# Patient Record
Sex: Female | Born: 1995 | Race: White | Hispanic: No | Marital: Single | Smoking: Never smoker
Health system: Southern US, Community
[De-identification: ages and names within clinical notes are randomized; demographics above are authoritative.]

## PROBLEM LIST (undated history)

## (undated) DIAGNOSIS — F419 Anxiety disorder, unspecified: Secondary | ICD-10-CM

## (undated) DIAGNOSIS — T7840XA Allergy, unspecified, initial encounter: Secondary | ICD-10-CM

## (undated) DIAGNOSIS — F32A Depression, unspecified: Secondary | ICD-10-CM

## (undated) DIAGNOSIS — L409 Psoriasis, unspecified: Secondary | ICD-10-CM

## (undated) HISTORY — DX: Depression, unspecified: F32.A

## (undated) HISTORY — PX: WISDOM TOOTH EXTRACTION: SHX21

## (undated) HISTORY — DX: Allergy, unspecified, initial encounter: T78.40XA

## (undated) HISTORY — DX: Anxiety disorder, unspecified: F41.9

---

## 2016-08-15 ENCOUNTER — Ambulatory Visit (INDEPENDENT_AMBULATORY_CARE_PROVIDER_SITE_OTHER): Payer: BLUE CROSS/BLUE SHIELD | Admitting: Physician Assistant

## 2016-08-15 ENCOUNTER — Encounter: Payer: Self-pay | Admitting: Physician Assistant

## 2016-08-15 VITALS — BP 118/82 | HR 72 | Temp 98.0°F | Resp 16 | Ht 68.0 in | Wt 208.0 lb

## 2016-08-15 DIAGNOSIS — M25561 Pain in right knee: Secondary | ICD-10-CM | POA: Insufficient documentation

## 2016-08-15 DIAGNOSIS — Z0000 Encounter for general adult medical examination without abnormal findings: Secondary | ICD-10-CM

## 2016-08-15 DIAGNOSIS — G8929 Other chronic pain: Secondary | ICD-10-CM

## 2016-08-15 NOTE — Progress Notes (Signed)
Patient: Diana Reed, Female    DOB: [birth year], 21 y.o.   MRN: [id number] Visit Date: 08/15/2016  Today's Provider: Trey Sailors, PA-C   Chief Complaint  Patient presents with  . Establish Care  . Annual Exam  . Insomnia    On going chronic issue, worsening in the last 2-3 years.   . Knee Pain    Bilateral; right worse than left.   Subjective:    Annual physical exam Diana Reed is a 21 y.o. female who presents today for health maintenance and complete physical. She feels fairly well. She reports exercising regularly. She reports she is sleeping poorly.  She moved here from [place] one year ago to live with her mom and step father in [place]. 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  Sees [facility], they prescribe her OCP and do breast exams. Next appt in July. Has had some menstrual issues that she will address with them.  MOM prediabetic DAD no health problems Two brothers, both healthy  Single Never sexually active  No Smoking, Drinking, No drugs.  No health problems. Has some down and depressed mood, some anxiety, thoughts racing at night. Started her senior year. Does not desire medications or counseling, talks to her sister in law often.  Also has some right knee pain after fall in basketball 4 years ago. Intermittently aches and becomes stiff. Able to walk okay.  ROI from previous practice  [facility], [place], [place]  Family hx + breast cancer in grandmother, cervical cancer in aunt. No colon cancer. -----------------------------------------------------------------   Review of Systems  Constitutional: Negative.   HENT: Negative.   Eyes: Negative.   Respiratory: Negative.   Cardiovascular: Negative.   Gastrointestinal: Negative.   Endocrine: Negative.   Genitourinary: Negative.   Musculoskeletal: Positive for arthralgias (Bilateral Knee pain).  Neurological: Positive for headaches.    Psychiatric/Behavioral: Positive for sleep disturbance. The patient is nervous/anxious.     Social History      She  reports that she has never smoked. She has never used smokeless tobacco. She reports that she does not drink alcohol or use drugs.       Social History   Social History  . Marital status: Single    Spouse name: N/A  . Number of children: N/A  . Years of education: N/A   Occupational History  . Full time student   . Part time employee    Social History Main Topics  . Smoking status: Never Smoker  . Smokeless tobacco: Never Used  . Alcohol use No  . Drug use: No  . Sexual activity: No   Other Topics Concern  . None   Social History Narrative  . None    No past medical history on file.   There are no active problems to display for this patient.   History reviewed. No pertinent surgical history.  Family History        Family Status  Relation Status  . Mother Alive  . Father Alive  . Brother Alive  . MGM Alive  . PGM Alive  . Brother Alive  . Mat Aunt Alive        Her family history includes Breast cancer in her paternal grandmother; Cervical cancer in her maternal aunt; Diabetes in her maternal grandmother; Healthy in her brother, brother, and father; Stomach cancer in her maternal grandmother.     No Known Allergies  Current Outpatient Prescriptions:  .  Norethindrone-Ethinyl Estradiol-Fe Biphas (LO LOESTRIN FE) 1 MG-10 MCG / 10 MCG tablet, Take 1 tablet by mouth daily., Disp: , Rfl:    Patient Care Team: Maryella ShiversPollak, Adriana M, PA-C as PCP - General (Physician Assistant)      Objective:   Vitals: BP 118/82 (BP Location: Left Arm, Patient Position: Sitting, Cuff Size: Normal)   Pulse 72   Temp 98 F (36.7 C) (Oral)   Resp 16   Ht 5\' 8"  (1.727 m)   Wt 208 lb (94.3 kg)   LMP  (LMP Unknown)   BMI 31.63 kg/m    Vitals:   08/15/16 1018  BP: 118/82  Pulse: 72  Resp: 16  Temp: 98 F (36.7 C)  TempSrc: Oral  Weight: 208 lb (94.3  kg)  Height: 5\' 8"  (1.727 m)     Physical Exam  Constitutional: She is oriented to person, place, and time. She appears well-developed and well-nourished.  HENT:  Right Ear: Tympanic membrane and external ear normal.  Left Ear: Tympanic membrane and external ear normal.  Mouth/Throat: Oropharynx is clear and moist. No oropharyngeal exudate.  Neck: Neck supple. No thyromegaly present.  Cardiovascular: Normal rate and regular rhythm.   Pulmonary/Chest: Effort normal and breath sounds normal.  Abdominal: Soft. Bowel sounds are normal.  Lymphadenopathy:    She has no cervical adenopathy.  Neurological: She is alert and oriented to person, place, and time.  Skin: Skin is warm and dry.  Psychiatric: She has a normal mood and affect. Her behavior is normal.     Depression Screen PHQ 2/9 Scores 08/15/2016 08/15/2016  PHQ - 2 Score 1 1  PHQ- 9 Score - 5      Assessment & Plan:     Routine Health Maintenance and Physical Exam  Exercise Activities and Dietary recommendations Goals    None       There is no immunization history on file for this patient.  Health Maintenance  Topic Date Due  . HIV Screening  09/21/2011  . TETANUS/TDAP  09/21/2015  . INFLUENZA VACCINE  09/27/2016     Discussed health benefits of physical activity, and encouraged her to engage in regular exercise appropriate for her age and condition.    1. Annual physical exam  Sign ROI for vacc records from previous PCP.   - Comprehensive metabolic panel - TSH - Lipid panel - CBC with Differential/Platelet  Return in about 1 year (around 08/15/2017) for CPE .  The entirety of the information documented in the History of Present Illness, Review of Systems and Physical Exam were personally obtained by me. Portions of this information were initially documented by Kavin LeechLaura Walsh, CMA and reviewed by me for thoroughness and accuracy.      --------------------------------------------------------------------    Trey SailorsAdriana M Pollak, PA-C  [facility]

## 2016-08-15 NOTE — Patient Instructions (Signed)

## 2016-08-16 ENCOUNTER — Telehealth: Payer: Self-pay

## 2016-08-16 LAB — CBC WITH DIFFERENTIAL/PLATELET
Basophils Absolute: 0 10*3/uL (ref 0.0–0.2)
Basos: 0 %
EOS (ABSOLUTE): 0.1 10*3/uL (ref 0.0–0.4)
Eos: 1 %
Hematocrit: 40.9 % (ref 34.0–46.6)
Hemoglobin: 13.6 g/dL (ref 11.1–15.9)
Immature Grans (Abs): 0 10*3/uL (ref 0.0–0.1)
Immature Granulocytes: 0 %
Lymphocytes Absolute: 1.8 10*3/uL (ref 0.7–3.1)
Lymphs: 23 %
MCH: 27.7 pg (ref 26.6–33.0)
MCHC: 33.3 g/dL (ref 31.5–35.7)
MCV: 83 fL (ref 79–97)
Monocytes Absolute: 0.6 10*3/uL (ref 0.1–0.9)
Monocytes: 8 %
Neutrophils Absolute: 5.3 10*3/uL (ref 1.4–7.0)
Neutrophils: 68 %
Platelets: 327 10*3/uL (ref 150–379)
RBC: 4.91 x10E6/uL (ref 3.77–5.28)
RDW: 13 % (ref 12.3–15.4)
WBC: 7.8 10*3/uL (ref 3.4–10.8)

## 2016-08-16 LAB — COMPREHENSIVE METABOLIC PANEL
ALT: 10 IU/L (ref 0–32)
AST: 12 IU/L (ref 0–40)
Albumin/Globulin Ratio: 1.4 (ref 1.2–2.2)
Albumin: 4.4 g/dL (ref 3.5–5.5)
Alkaline Phosphatase: 43 IU/L (ref 39–117)
BUN/Creatinine Ratio: 11 (ref 9–23)
BUN: 9 mg/dL (ref 6–20)
Bilirubin Total: 0.3 mg/dL (ref 0.0–1.2)
CO2: 20 mmol/L (ref 20–29)
Calcium: 9.6 mg/dL (ref 8.7–10.2)
Chloride: 104 mmol/L (ref 96–106)
Creatinine, Ser: 0.8 mg/dL (ref 0.57–1.00)
GFR calc Af Amer: 124 mL/min/{1.73_m2} (ref >59)
GFR calc non Af Amer: 107 mL/min/{1.73_m2} (ref >59)
Globulin, Total: 3.1 g/dL (ref 1.5–4.5)
Glucose: 91 mg/dL (ref 65–99)
Potassium: 4.2 mmol/L (ref 3.5–5.2)
Sodium: 139 mmol/L (ref 134–144)
Total Protein: 7.5 g/dL (ref 6.0–8.5)

## 2016-08-16 LAB — LIPID PANEL
Chol/HDL Ratio: 3.9 ratio (ref 0.0–4.4)
Cholesterol, Total: 202 mg/dL — ABNORMAL HIGH (ref 100–169)
HDL: 52 mg/dL (ref >39)
LDL Calculated: 121 mg/dL — ABNORMAL HIGH (ref 0–109)
Triglycerides: 144 mg/dL — ABNORMAL HIGH (ref 0–89)
VLDL Cholesterol Cal: 29 mg/dL (ref 5–40)

## 2016-08-16 LAB — TSH: TSH: 1.5 u[IU]/mL (ref 0.450–4.500)

## 2016-08-16 NOTE — Telephone Encounter (Signed)
-----   Message from Adline PealsElena D Desanto, CMA sent at 08/16/2016  8:50 AM EDT ----- [facility]  ED

## 2016-08-16 NOTE — Telephone Encounter (Signed)
LMTCB 08/16/2016  Thanks,   -Laura  

## 2016-09-08 ENCOUNTER — Telehealth: Payer: Self-pay | Admitting: Physician Assistant

## 2016-09-08 NOTE — Telephone Encounter (Signed)
[facility]

## 2016-09-08 NOTE — Telephone Encounter (Signed)
ROI faxed to [facility]

## 2016-09-25 ENCOUNTER — Ambulatory Visit (INDEPENDENT_AMBULATORY_CARE_PROVIDER_SITE_OTHER): Payer: BLUE CROSS/BLUE SHIELD | Admitting: Advanced Practice Midwife

## 2016-09-25 ENCOUNTER — Encounter: Payer: Self-pay | Admitting: Advanced Practice Midwife

## 2016-09-25 VITALS — BP 118/74 | Ht 68.0 in | Wt 208.0 lb

## 2016-09-25 DIAGNOSIS — Z3041 Encounter for surveillance of contraceptive pills: Secondary | ICD-10-CM | POA: Diagnosis not present

## 2016-09-25 DIAGNOSIS — Z0000 Encounter for general adult medical examination without abnormal findings: Secondary | ICD-10-CM

## 2016-09-25 MED ORDER — NORETHIN-ETH ESTRAD-FE BIPHAS 1 MG-10 MCG / 10 MCG PO TABS: 1.0000 | ORAL_TABLET | Freq: Every day | ORAL | 3 refills | Status: DC

## 2016-09-25 NOTE — Progress Notes (Signed)
Patient ID: Diana Reed, female   DOB: [birth year], 21 y.o.   MRN: [id number]     Gynecology Annual Exam  PCP: Trey SailorsPollak, Adriana M, PA-C  Chief Complaint:  Chief Complaint  Patient presents with  . Annual Exam    History of Present Illness: Patient is a 21 y.o. G0P0000 presents for annual exam. The patient has no complaints today.   LMP: No LMP recorded. Patient is not currently having periods (Reason: Oral contraceptives). Menarche:not applicable Postcoital Bleeding: not applicable Dysmenorrhea: not applicable  The patient is not sexually active. She currently uses OCP (estrogen/progesterone) for contraception. She denies dyspareunia.  The patient does perform self breast exams.  There is notable family history of breast or ovarian cancer in her family.  The patient wears seatbelts: yes.  The patient has regular exercise: yes.    The patient denies current symptoms of depression. She admits some anxiety and mild depression she is able to cope with.  Review of Systems: Review of Systems  Constitutional: Negative.   HENT: Negative.   Eyes: Negative.   Respiratory: Negative.   Cardiovascular: Negative.   Gastrointestinal: Negative.   Genitourinary: Negative.   Musculoskeletal: Negative.   Skin: Negative.   Neurological: Negative.   Endo/Heme/Allergies: Negative.   Psychiatric/Behavioral: Negative.     Past Medical History:  History reviewed. No pertinent past medical history.  Past Surgical History:  History reviewed. No pertinent surgical history.  Gynecologic History:  No LMP recorded. Patient is not currently having periods (Reason: Oral contraceptives). Contraception: OCP (estrogen/progesterone) Last Pap: Results were: has never had a PAP   Obstetric History: G0P0000  Family History:  Family History  Problem Relation Age of Onset  . Healthy Father   . Healthy Brother   . Stomach cancer Maternal Grandmother   . Diabetes Maternal Grandmother   .  Breast cancer Paternal Grandmother   . Healthy Brother   . Cervical cancer Maternal Aunt     Social History:  Social History   Social History  . Marital status: Single    Spouse name: N/A  . Number of children: N/A  . Years of education: N/A   Occupational History  . Full time student   . Part time employee    Social History Main Topics  . Smoking status: Never Smoker  . Smokeless tobacco: Never Used  . Alcohol use No  . Drug use: No  . Sexual activity: No   Other Topics Concern  . Not on file   Social History Narrative  . No narrative on file    Allergies:  No Known Allergies  Medications: Prior to Admission medications   Medication Sig Start Date End Date Taking? Authorizing Provider  Norethindrone-Ethinyl Estradiol-Fe Biphas (LO LOESTRIN FE) 1 MG-10 MCG / 10 MCG tablet Take 1 tablet by mouth daily.    [provider]    Physical Exam Vitals: Blood pressure 118/74, height 5\' 8"  (1.727 m), weight 208 lb (94.3 kg).  General: NAD HEENT: normocephalic, anicteric Thyroid: no enlargement, no palpable nodules Pulmonary: No increased work of breathing, CTAB Cardiovascular: RRR, distal pulses 2+ Breast: Breast symmetrical, no tenderness, no palpable nodules or masses, no skin or nipple retraction present, no nipple discharge.  No axillary or supraclavicular lymphadenopathy. Abdomen: NABS, soft, non-tender, non-distended.  Umbilicus without lesions.  No hepatomegaly, splenomegaly or masses palpable. No evidence of hernia  Genitourinary: deferred for no PAP and no gyn concerns Extremities: no edema, erythema, or tenderness Neurologic: Grossly intact Psychiatric: mood appropriate, affect  full   Assessment: 21 y.o. G0P0000 Well Woman exam  Plan: Problem List Items Addressed This Visit    None    Visit Diagnoses    Well woman exam without gynecological exam    -  Primary      1) 4) Gardasil Series discussed and if applicable offered to patient -  Patient has previously completed 3 shot series   2) STI screening was offered and declined   3) ASCCP guidelines and rational discussed.  Patient opts for beginning at age 21 or with sexual activity screening interval  4) Contraception - Patient prefers to continue with current OCP for regulation of periods   5) Follow up 1 year for routine annual exam   Tresea MallJane Larra Crunkleton, CNM

## 2017-09-05 ENCOUNTER — Other Ambulatory Visit: Payer: Self-pay | Admitting: Advanced Practice Midwife

## 2017-09-05 DIAGNOSIS — Z3041 Encounter for surveillance of contraceptive pills: Secondary | ICD-10-CM

## 2017-10-05 ENCOUNTER — Ambulatory Visit (INDEPENDENT_AMBULATORY_CARE_PROVIDER_SITE_OTHER): Payer: BLUE CROSS/BLUE SHIELD | Admitting: Physician Assistant

## 2017-10-05 ENCOUNTER — Other Ambulatory Visit (HOSPITAL_COMMUNITY)
Admission: RE | Admit: 2017-10-05 | Discharge: 2017-10-05 | Disposition: A | Discharge status: Home / Self Care | Payer: BLUE CROSS/BLUE SHIELD | Source: Ambulatory Visit | Attending: Physician Assistant | Admitting: Physician Assistant

## 2017-10-05 ENCOUNTER — Encounter: Payer: Self-pay | Admitting: Physician Assistant

## 2017-10-05 VITALS — BP 138/84 | HR 98 | Temp 98.5°F | Wt 206.0 lb

## 2017-10-05 DIAGNOSIS — Z124 Encounter for screening for malignant neoplasm of cervix: Secondary | ICD-10-CM

## 2017-10-05 DIAGNOSIS — Z01419 Encounter for gynecological examination (general) (routine) without abnormal findings: Secondary | ICD-10-CM | POA: Diagnosis not present

## 2017-10-05 DIAGNOSIS — Z23 Encounter for immunization: Secondary | ICD-10-CM

## 2017-10-05 DIAGNOSIS — Z0000 Encounter for general adult medical examination without abnormal findings: Secondary | ICD-10-CM

## 2017-10-05 DIAGNOSIS — Z3041 Encounter for surveillance of contraceptive pills: Secondary | ICD-10-CM

## 2017-10-05 MED ORDER — NORETHIN-ETH ESTRAD-FE BIPHAS 1 MG-10 MCG / 10 MCG PO TABS: 1.0000 | ORAL_TABLET | Freq: Every day | ORAL | 3 refills | Status: DC

## 2017-10-05 NOTE — Progress Notes (Signed)
Patient: Diana Reed, Female    DOB: 03-02-1995, 22 y.o.   MRN: [id number] Visit Date: 10/05/2017  Today's Provider: Trey Sailors, PA-C   Chief Complaint  Patient presents with  . Annual Exam   Subjective:    Annual physical exam Diana Reed is a 22 y.o. female who presents today for health maintenance and complete physical. She feels well. She reports exercising some. She reports she is sleeping poorly.  Due for tetanus shot today. Due for first PAP smear. She is also needing a refill on her birth control. History of migraines without aura that have decreased in frequency, no history of blood clot or stroke, not smoking.  -----------------------------------------------------------------   Review of Systems  Constitutional: Negative.   HENT: Negative.   Eyes: Negative.   Respiratory: Negative.   Cardiovascular: Negative.   Gastrointestinal: Negative.   Endocrine: Negative.   Genitourinary: Negative.   Musculoskeletal: Positive for back pain. Negative for arthralgias, gait problem, joint swelling, myalgias, neck pain and neck stiffness.  Skin: Negative.   Allergic/Immunologic: Negative.   Neurological: Negative.   Hematological: Negative.   Psychiatric/Behavioral: Negative for agitation, behavioral problems, confusion, decreased concentration, dysphoric mood, hallucinations, self-injury, sleep disturbance and suicidal ideas. The patient is nervous/anxious. The patient is not hyperactive.     Social History      She  reports that she has never smoked. She has never used smokeless tobacco. She reports that she does not drink alcohol or use drugs.       Social History   Socioeconomic History  . Marital status: Single    Spouse name: Not on file  . Number of children: Not on file  . Years of education: Not on file  . Highest education level: Not on file  Occupational History  . Occupation: Full Neurosurgeon  . Occupation: Part time employee    Social Needs  . Financial resource strain: Not on file  . Food insecurity:    Worry: Not on file    Inability: Not on file  . Transportation needs:    Medical: Not on file    Non-medical: Not on file  Tobacco Use  . Smoking status: Never Smoker  . Smokeless tobacco: Never Used  Substance and Sexual Activity  . Alcohol use: No  . Drug use: No  . Sexual activity: Never    Birth control/protection: Pill  Lifestyle  . Physical activity:    Days per week: Not on file    Minutes per session: Not on file  . Stress: Not on file  Relationships  . Social connections:    Talks on phone: Not on file    Gets together: Not on file    Attends religious service: Not on file    Active member of club or organization: Not on file    Attends meetings of clubs or organizations: Not on file    Relationship status: Not on file  Other Topics Concern  . Not on file  Social History Narrative  . Not on file    No past medical history on file.   Patient Active Problem List   Diagnosis Date Noted  . Right knee pain 08/15/2016    Past Surgical History:  Procedure Laterality Date  . WISDOM TOOTH EXTRACTION      Family History        Family Status  Relation Name Status  . Mother  Alive  . Father  Alive  . Brother  Alive  .  MGM  Alive  . PGM  Alive  . Brother  Alive  . Mat Aunt  Alive        Her family history includes Breast cancer in her paternal grandmother; Cervical cancer in her maternal aunt; Colon cancer in her maternal grandmother; Diabetes in her maternal grandmother; Healthy in her brother, brother, and father.      No Known Allergies   Current Outpatient Medications:  .  Norethindrone-Ethinyl Estradiol-Fe Biphas (LO LOESTRIN FE) 1 MG-10 MCG / 10 MCG tablet, Take 1 tablet by mouth daily., Disp: 3 Package, Rfl: 3   Patient Care Team: Maryella ShiversPollak, Otoniel Myhand M, PA-C as PCP - General (Physician Assistant)      Objective:   Vitals: BP 138/84 (BP Location: Right Arm, Patient  Position: Sitting, Cuff Size: Normal)   Pulse 98   Temp 98.5 F (36.9 C) (Oral)   Wt 206 lb (93.4 kg)   SpO2 97%   BMI 31.32 kg/m    Vitals:   10/05/17 1413  BP: 138/84  Pulse: 98  Temp: 98.5 F (36.9 C)  TempSrc: Oral  SpO2: 97%  Weight: 206 lb (93.4 kg)     Physical Exam  Constitutional: She appears well-developed and well-nourished.  Cardiovascular: Normal rate and regular rhythm.  Pulmonary/Chest: Effort normal and breath sounds normal. She exhibits no mass, no tenderness, no laceration, no crepitus, no edema, no deformity, no swelling and no retraction. Right breast exhibits no inverted nipple, no mass, no nipple discharge, no skin change and no tenderness. Left breast exhibits no inverted nipple, no mass, no nipple discharge, no skin change and no tenderness. No breast swelling, tenderness, discharge or bleeding. Breasts are symmetrical.  Genitourinary: Uterus normal. There is no rash, tenderness, lesion or injury on the right labia. There is no rash, tenderness, lesion or injury on the left labia. Uterus is not deviated, not enlarged, not fixed and not tender. Cervix exhibits no motion tenderness, no discharge and no friability. Right adnexum displays no mass, no tenderness and no fullness. Left adnexum displays no mass, no tenderness and no fullness. No erythema, tenderness or bleeding in the vagina. No foreign body in the vagina. No signs of injury around the vagina. No vaginal discharge found.  Skin: Skin is warm and dry.  Psychiatric: She has a normal mood and affect. Her behavior is normal.     Depression Screen PHQ 2/9 Scores 10/05/2017 08/15/2016 08/15/2016  PHQ - 2 Score 1 1 1   PHQ- 9 Score 7 - 5      Assessment & Plan:   1. Annual physical exam   2. Encounter for surveillance of contraceptive pills  - Norethindrone-Ethinyl Estradiol-Fe Biphas (LO LOESTRIN FE) 1 MG-10 MCG / 10 MCG tablet; Take 1 tablet by mouth daily.  Dispense: 3 Package; Refill: 3  3.  Cervical cancer screening  - Cytology - PAP  4. Need for Tdap vaccination  - Tdap vaccine greater than or equal to 7yo IM  Return in about 1 year (around 10/06/2018) for CPE.  The entirety of the information documented in the History of Present Illness, Review of Systems and Physical Exam were personally obtained by me. Portions of this information were initially documented by Kavin LeechLaura Walsh, CMA and reviewed by me for thoroughness and accuracy.   --------------------------------------------------------------------    Trey SailorsAdriana M Ayanni Tun, PA-C  [facility]

## 2017-10-05 NOTE — Patient Instructions (Signed)

## 2017-10-08 LAB — CYTOLOGY - PAP
Chlamydia: NEGATIVE
Diagnosis: NEGATIVE
Neisseria Gonorrhea: NEGATIVE

## 2017-10-09 ENCOUNTER — Telehealth: Payer: Self-pay

## 2017-10-09 NOTE — Telephone Encounter (Signed)
Left message advising pt.  (Per DPR)  Thanks,   -Cliffard Hair  

## 2017-10-09 NOTE — Telephone Encounter (Signed)
-----   Message from Trey SailorsAdriana M Pollak, [place] JerseyPA-C sent at 10/09/2017 12:12 PM EDT ----- PAP normal, gonorrhea and chlamydia are negative. Repeat 3 years.

## 2018-01-02 ENCOUNTER — Encounter: Payer: Self-pay | Admitting: Physician Assistant

## 2018-01-02 ENCOUNTER — Ambulatory Visit: Payer: BLUE CROSS/BLUE SHIELD | Admitting: Physician Assistant

## 2018-01-02 VITALS — BP 110/80 | HR 72 | Temp 98.0°F | Resp 16 | Ht 68.0 in | Wt 212.4 lb

## 2018-01-02 DIAGNOSIS — R21 Rash and other nonspecific skin eruption: Secondary | ICD-10-CM | POA: Diagnosis not present

## 2018-01-02 MED ORDER — CLOBETASOL PROPIONATE 0.05 % EX FOAM: CUTANEOUS | 0 refills | Status: DC

## 2018-01-02 NOTE — Progress Notes (Signed)
       Patient: Diana Reed Female    DOB: [birth year]   22 y.o.   MRN: [id number] Visit Date: 01/02/2018  Today's Provider: Trey Sailors, PA-C   Chief Complaint  Patient presents with  . Hair/Scalp Problem   Subjective:    HPI   Patient here today c/o itchy rash on scalp on and off for several years. She reports there will be red patches on her scalp that will turn dry and scaly. She reports these areas of her scalp itch. The rash is intermittent. Does not report any new skin products.      No Known Allergies   Current Outpatient Medications:  .  Norethindrone-Ethinyl Estradiol-Fe Biphas (LO LOESTRIN FE) 1 MG-10 MCG / 10 MCG tablet, Take 1 tablet by mouth daily., Disp: 3 Package, Rfl: 3  Review of Systems  Constitutional: Negative.   Respiratory: Negative.   Skin: Positive for rash.    Social History   Tobacco Use  . Smoking status: Never Smoker  . Smokeless tobacco: Never Used  Substance Use Topics  . Alcohol use: No   Objective:   BP 110/80 (BP Location: Left Arm, Patient Position: Sitting, Cuff Size: Normal)   Pulse 72   Temp 98 F (36.7 C) (Oral)   Resp 16   Ht 5\' 8"  (1.727 m)   Wt 212 lb 6.4 oz (96.3 kg)   SpO2 99%   BMI 32.30 kg/m  Vitals:   01/02/18 1131  BP: 110/80  Pulse: 72  Resp: 16  Temp: 98 F (36.7 C)  TempSrc: Oral  SpO2: 99%  Weight: 212 lb 6.4 oz (96.3 kg)  Height: 5\' 8"  (1.727 m)     Physical Exam  Constitutional: She is oriented to person, place, and time. She appears well-developed and well-nourished.  Neurological: She is alert and oriented to person, place, and time.  Skin: Skin is warm and dry. No rash noted.  Scalp examined and rash not present today.   Psychiatric: She has a normal mood and affect. Her behavior is normal.        Assessment & Plan:     1. Rash of face  Sounds like it may be psoriasis, will give foam as below.  - clobetasol (OLUX) 0.05 % topical foam; Use twice daily for one week on  scalp during flare.  Dispense: 50 g; Refill: 0  Return if symptoms worsen or fail to improve.  The entirety of the information documented in the History of Present Illness, Review of Systems and Physical Exam were personally obtained by me. Portions of this information were initially documented by Rondel Baton, CMA and reviewed by me for thoroughness and accuracy.         Trey Sailors, PA-C  [facility]

## 2018-01-02 NOTE — Patient Instructions (Signed)
Psoriasis  Psoriasis is a long-term (chronic) skin condition. It causes raised, red patches (plaques) on your skin that look silvery. The red patches may show up anywhere on your body. They can be any size or shape.  Psoriasis can come and go. It can range from mild to very bad. It cannot be passed from one person to another (not contagious). There is no cure for this condition, but it can be helped with treatment.  Follow these instructions at home:  Skin Care   Apply moisturizers to your skin as needed. Only use those that your doctor has said are okay.   Apply cool compresses to the affected areas.   Do not scratch your skin.  Lifestyle     Do not use tobacco products. This includes cigarettes, chewing tobacco, and e-cigarettes. If you need help quitting, ask your doctor.   Drink little or no alcohol.   Try to lower your stress. Meditation or yoga may help.   Get sun as told by your doctor. Do not get sunburned.   Think about joining a psoriasis support group.  Medicines   Take or use over-the-counter and prescription medicines only as told by your doctor.   If you were prescribed an antibiotic, take or use it as told by your doctor. Do not stop taking the antibiotic even if your condition starts to get better.  General instructions   Keep a journal. Use this to help track what triggers an outbreak. Try to avoid any triggers.   See a counselor or social worker if you feel very sad, upset, or hopeless about your condition and these feelings affect your work or relationships.   Keep all follow-up visits as told by your doctor. This is important.  Contact a doctor if:   Your pain gets worse.   You have more redness or warmth in the affected areas.   You have new pain or stiffness in your joints.   Your pain or stiffness in your joints gets worse.   Your nails start to break easily.   Your nails pull away from the nail bed easily.   You have a fever.   You feel very sad (depressed).  This  information is not intended to replace advice given to you by your health care provider. Make sure you discuss any questions you have with your health care provider.  Document Released: 03/23/2004 Document Revised: 07/22/2015 Document Reviewed: 07/01/2014  [employer]vier Interactive Patient Education  2018 [employer]

## 2018-05-15 ENCOUNTER — Other Ambulatory Visit: Payer: Self-pay

## 2018-05-15 ENCOUNTER — Encounter: Payer: Self-pay | Admitting: Physician Assistant

## 2018-05-15 ENCOUNTER — Ambulatory Visit: Payer: BLUE CROSS/BLUE SHIELD | Admitting: Physician Assistant

## 2018-05-15 VITALS — BP 124/82 | HR 94 | Temp 97.9°F | Resp 16 | Wt 224.0 lb

## 2018-05-15 DIAGNOSIS — Z131 Encounter for screening for diabetes mellitus: Secondary | ICD-10-CM | POA: Diagnosis not present

## 2018-05-15 DIAGNOSIS — Z130 Encounter for screening for diseases of the blood and blood-forming organs and certain disorders involving the immune mechanism: Secondary | ICD-10-CM | POA: Diagnosis not present

## 2018-05-15 DIAGNOSIS — Z13220 Encounter for screening for lipoid disorders: Secondary | ICD-10-CM

## 2018-05-15 DIAGNOSIS — G4700 Insomnia, unspecified: Secondary | ICD-10-CM | POA: Diagnosis not present

## 2018-05-15 MED ORDER — QUETIAPINE FUMARATE 25 MG PO TABS: 25.0000 mg | ORAL_TABLET | Freq: Every day | ORAL | 0 refills | Status: DC

## 2018-05-15 NOTE — Progress Notes (Signed)
Patient: Diana Reed Female    DOB: [birth year]   22 y.o.   MRN: [id number] Visit Date: 05/15/2018  Today's Provider: Trey Sailors, PA-C   Chief Complaint  Patient presents with  . Insomnia   Subjective:     HPI   Patient has been trouble falling asleep and than staying sleep. Has had hard time falling asleep since age 60. Did get 6-7 hours of sleep at that time. As a child, went to bed at 8 PM and fell asleep at 11. This pattern continued through elementary and middle school. In high school did not fall asleep until 1AM and getting up at 5-6 AM. Junior year was the worst year, got maximum of three hours a night. Senior year was exhausted and slept 13 hours a day and reports she was going through depression, never treated with medications. College fell asleep again at 1-2 am in the morning, took naps in college. Patient states she averages about 3-4 hours a night. Patient states she has always had this problem. Symptoms have worsened lately. Patient reports she used to snore but has no witnessed apnea. Hard to arouse during sleep. Will fall asleep after 3-4 days of not sleeping. Very tired on some days. Uncle with bipolar disorder. Reports instances during the middle of the night 2-3 times per week patient would have a lot of energy and complete all of her school work. Mother also has sleep issues. First degree family member does not have sleep   Caffeine: frappucino 1-2 times per week. No energy drinks, does eat chocolate 1-2 times per week. No tea.   Blue light: Scrolls through phone first ten minutes but then puts this away. Does not watch TV before bed.   Has taken unisom and hydroxyzine. Unisom doesn't always work because it makes her drowsy but doesn't always make her fall asleep.    No Known Allergies   Current Outpatient Medications:  .  clobetasol (OLUX) 0.05 % topical foam, Use twice daily for one week on scalp during flare., Disp: 50 g, Rfl: 0 .   Norethindrone-Ethinyl Estradiol-Fe Biphas (LO LOESTRIN FE) 1 MG-10 MCG / 10 MCG tablet, Take 1 tablet by mouth daily., Disp: 3 Package, Rfl: 3 .  hydrOXYzine (ATARAX/VISTARIL) 25 MG tablet, TAKE 3 TABLETS BY MOUTH EVERY DAY AT BEDTIME, Disp: , Rfl:  .  QUEtiapine (SEROQUEL) 25 MG tablet, Take 1 tablet (25 mg total) by mouth at bedtime for 30 days., Disp: 30 tablet, Rfl: 0  Review of Systems  Constitutional: Negative for appetite change, chills, fatigue and fever.  Respiratory: Negative for chest tightness and shortness of breath.   Cardiovascular: Negative for chest pain and palpitations.  Gastrointestinal: Negative for abdominal pain, nausea and vomiting.  Neurological: Negative for dizziness and weakness.    Social History   Tobacco Use  . Smoking status: Never Smoker  . Smokeless tobacco: Never Used  Substance Use Topics  . Alcohol use: No      Objective:   BP 124/82 (BP Location: Right Arm, Patient Position: Sitting, Cuff Size: Large)   Pulse 94   Temp 97.9 F (36.6 C) (Oral)   Resp 16   Wt 224 lb (101.6 kg)   SpO2 99%   BMI 34.06 kg/m  Vitals:   05/15/18 0831  BP: 124/82  Pulse: 94  Resp: 16  Temp: 97.9 F (36.6 C)  TempSrc: Oral  SpO2: 99%  Weight: 224 lb (101.6 kg)     Physical  Exam Constitutional:      Appearance: Normal appearance.  Cardiovascular:     Rate and Rhythm: Normal rate.  Pulmonary:     Effort: Pulmonary effort is normal.  Skin:    General: Skin is warm and dry.  Neurological:     Mental Status: She is alert and oriented to person, place, and time. Mental status is at baseline.  Psychiatric:        Mood and Affect: Mood normal.        Behavior: Behavior normal.        Thought Content: Thought content normal.         Assessment & Plan    1. Insomnia, unspecified type  Chronic issue and worsening. Patient seems to have had sleep issues in the past. Questionable mood disregulation, possible bipolar depression. Antihistamines have  not worked for patient. Patient Does not wish for solely sleep medication. I do not think she is drinking excessive caffeine but she can try to completely eliminate this. Will start on low dose seroquel for sleep and possible mood disorder as she has mentioned depressive issues in the past during her junior year of high school. Will see her back in one month. Baseline labwork to monitor for any metabolic syndrome.   - QUEtiapine (SEROQUEL) 25 MG tablet; Take 1 tablet (25 mg total) by mouth at bedtime for 30 days.  Dispense: 30 tablet; Refill: 0  2. Diabetes mellitus screening  - Comprehensive Metabolic Panel (CMET)  3. Lipid screening  - Lipid Profile  4. Screening for deficiency anemia  - CBC with Differential  The entirety of the information documented in the History of Present Illness, Review of Systems and Physical Exam were personally obtained by me. Portions of this information were initially documented by April Miller, CMA and reviewed by me for thoroughness and accuracy.         Trey Sailors, PA-C  [facility]

## 2018-05-15 NOTE — Patient Instructions (Signed)

## 2018-05-16 ENCOUNTER — Telehealth: Payer: Self-pay

## 2018-05-16 LAB — CBC WITH DIFFERENTIAL/PLATELET
Basophils Absolute: 0.1 10*3/uL (ref 0.0–0.2)
Basos: 1 %
EOS (ABSOLUTE): 0.1 10*3/uL (ref 0.0–0.4)
Eos: 1 %
Hematocrit: 42.1 % (ref 34.0–46.6)
Hemoglobin: 13.7 g/dL (ref 11.1–15.9)
Immature Grans (Abs): 0 10*3/uL (ref 0.0–0.1)
Immature Granulocytes: 0 %
Lymphocytes Absolute: 2 10*3/uL (ref 0.7–3.1)
Lymphs: 24 %
MCH: 28 pg (ref 26.6–33.0)
MCHC: 32.5 g/dL (ref 31.5–35.7)
MCV: 86 fL (ref 79–97)
Monocytes Absolute: 0.6 10*3/uL (ref 0.1–0.9)
Monocytes: 8 %
Neutrophils Absolute: 5.5 10*3/uL (ref 1.4–7.0)
Neutrophils: 66 %
Platelets: 344 10*3/uL (ref 150–450)
RBC: 4.9 x10E6/uL (ref 3.77–5.28)
RDW: 12.6 % (ref 11.7–15.4)
WBC: 8.3 10*3/uL (ref 3.4–10.8)

## 2018-05-16 LAB — COMPREHENSIVE METABOLIC PANEL
ALT: 13 IU/L (ref 0–32)
AST: 14 IU/L (ref 0–40)
Albumin/Globulin Ratio: 1.5 (ref 1.2–2.2)
Albumin: 4.1 g/dL (ref 3.9–5.0)
Alkaline Phosphatase: 46 IU/L (ref 39–117)
BUN/Creatinine Ratio: 16 (ref 9–23)
BUN: 12 mg/dL (ref 6–20)
Bilirubin Total: 0.3 mg/dL (ref 0.0–1.2)
CO2: 21 mmol/L (ref 20–29)
Calcium: 9.7 mg/dL (ref 8.7–10.2)
Chloride: 104 mmol/L (ref 96–106)
Creatinine, Ser: 0.77 mg/dL (ref 0.57–1.00)
GFR calc Af Amer: 128 mL/min/{1.73_m2} (ref >59)
GFR calc non Af Amer: 111 mL/min/{1.73_m2} (ref >59)
Globulin, Total: 2.7 g/dL (ref 1.5–4.5)
Glucose: 86 mg/dL (ref 65–99)
Potassium: 4.4 mmol/L (ref 3.5–5.2)
Sodium: 141 mmol/L (ref 134–144)
Total Protein: 6.8 g/dL (ref 6.0–8.5)

## 2018-05-16 LAB — LIPID PANEL
Chol/HDL Ratio: 3.8 ratio (ref 0.0–4.4)
Cholesterol, Total: 196 mg/dL (ref 100–199)
HDL: 52 mg/dL (ref >39)
LDL Calculated: 123 mg/dL — ABNORMAL HIGH (ref 0–99)
Triglycerides: 107 mg/dL (ref 0–149)
VLDL Cholesterol Cal: 21 mg/dL (ref 5–40)

## 2018-05-16 NOTE — Telephone Encounter (Signed)
-----   Message from Trey Sailors, [place] sent at 05/16/2018 11:36 AM EDT ----- Labs normal. See her in follow up.

## 2018-05-20 NOTE — Telephone Encounter (Signed)
Patient has been advised. KW 

## 2018-05-20 NOTE — Telephone Encounter (Signed)
LMTCB

## 2018-06-19 ENCOUNTER — Ambulatory Visit (INDEPENDENT_AMBULATORY_CARE_PROVIDER_SITE_OTHER): Payer: BLUE CROSS/BLUE SHIELD | Admitting: Physician Assistant

## 2018-06-19 DIAGNOSIS — G4700 Insomnia, unspecified: Secondary | ICD-10-CM | POA: Diagnosis not present

## 2018-06-19 DIAGNOSIS — Z5181 Encounter for therapeutic drug level monitoring: Secondary | ICD-10-CM

## 2018-06-19 MED ORDER — QUETIAPINE FUMARATE 50 MG PO TABS: 50.0000 mg | ORAL_TABLET | Freq: Every day | ORAL | 0 refills | Status: DC

## 2018-06-19 NOTE — Patient Instructions (Signed)

## 2018-06-19 NOTE — Progress Notes (Signed)
Subjective:    Patient ID: Diana Reed, female    DOB: February 07, 1996, 23 y.o.   MRN: [id number]  Diana Reed is a 23 y.o. female presenting on 06/19/2018 for No chief complaint on file.  Virtual Visit via Video Note  I connected with Diana Reed on 06/19/18 at  8:00 AM EDT by a video enabled telemedicine application and verified that I am speaking with the correct person using two identifiers.   I discussed the limitations of evaluation and management by telemedicine and the availability of in person appointments. The patient expressed understanding and agreed to proceed.  HPI   Patient presents today with insomnia. Last visit in this clinic she reported a longstanding history of insomnia and also issues with depression. Had failed unisom and hydroxyzine. Her baseline CBC, CMET and Lipid panel were normal and she was started on Seroquel 25 mg nightly. She reports this helps her fall asleep and stay asleep. She still may wake up during the night but feels she is no longer jolted awake. She reports her mood may be slightly more "even" but otherwise denies SI/HI or agitation. She would like to continue the medication and consider increasing the dose.   Social History   Tobacco Use  . Smoking status: Never Smoker  . Smokeless tobacco: Never Used  Substance Use Topics  . Alcohol use: No  . Drug use: No    Review of Systems Per HPI unless specifically indicated above     Objective:    There were no vitals taken for this visit.  Wt Readings from Last 3 Encounters:  05/15/18 224 lb (101.6 kg)  01/02/18 212 lb 6.4 oz (96.3 kg)  10/05/17 206 lb (93.4 kg)    Physical Exam Results for orders placed or performed in visit on 05/15/18  Comprehensive Metabolic Panel (CMET)  Result Value Ref Range   Glucose 86 65 - 99 mg/dL   BUN 12 6 - 20 mg/dL   Creatinine, Ser 4.60 0.57 - 1.00 mg/dL   GFR calc non Af Amer 111 >59 mL/min/1.73   GFR calc Af Amer 128 >59 mL/min/1.73    BUN/Creatinine Ratio 16 9 - 23   Sodium 141 134 - 144 mmol/L   Potassium 4.4 3.5 - 5.2 mmol/L   Chloride 104 96 - 106 mmol/L   CO2 21 20 - 29 mmol/L   Calcium 9.7 8.7 - 10.2 mg/dL   Total Protein 6.8 6.0 - 8.5 g/dL   Albumin 4.1 3.9 - 5.0 g/dL   Globulin, Total 2.7 1.5 - 4.5 g/dL   Albumin/Globulin Ratio 1.5 1.2 - 2.2   Bilirubin Total 0.3 0.0 - 1.2 mg/dL   Alkaline Phosphatase 46 39 - 117 IU/L   AST 14 0 - 40 IU/L   ALT 13 0 - 32 IU/L  Lipid Profile  Result Value Ref Range   Cholesterol, Total 196 100 - 199 mg/dL   Triglycerides 479 0 - 149 mg/dL   HDL 52 >98 mg/dL   VLDL Cholesterol Cal 21 5 - 40 mg/dL   LDL Calculated 721 (H) 0 - 99 mg/dL   Chol/HDL Ratio 3.8 0.0 - 4.4 ratio  CBC with Differential  Result Value Ref Range   WBC 8.3 3.4 - 10.8 x10E3/uL   RBC 4.90 3.77 - 5.28 x10E6/uL   Hemoglobin 13.7 11.1 - 15.9 g/dL   Hematocrit 58.7 27.6 - 46.6 %   MCV 86 79 - 97 fL   MCH 28.0 26.6 - 33.0 pg   MCHC  32.5 31.5 - 35.7 g/dL   RDW 16.112.6 09.611.7 - 04.515.4 %   Platelets 344 150 - 450 x10E3/uL   Neutrophils 66 Not Estab. %   Lymphs 24 Not Estab. %   Monocytes 8 Not Estab. %   Eos 1 Not Estab. %   Basos 1 Not Estab. %   Neutrophils Absolute 5.5 1.4 - 7.0 x10E3/uL   Lymphocytes Absolute 2.0 0.7 - 3.1 x10E3/uL   Monocytes Absolute 0.6 0.1 - 0.9 x10E3/uL   EOS (ABSOLUTE) 0.1 0.0 - 0.4 x10E3/uL   Basophils Absolute 0.1 0.0 - 0.2 x10E3/uL   Immature Granulocytes 0 Not Estab. %   Immature Grans (Abs) 0.0 0.0 - 0.1 x10E3/uL      Assessment & Plan:  1. Insomnia, unspecified type  Increase from 25 mg to 50 mg. If she feels this is too much, may cut pill in half and return to 25 mg nightly dose. Sending in 30 days to local and 90 days to mail order. Monitoring labs as below to be gotten in 6 weeks. Will see her in follow up for CPE on 10/09/2018 at 8:20 AM.  - QUEtiapine (SEROQUEL) 50 MG tablet; Take 1 tablet (50 mg total) by mouth at bedtime for 30 days.  Dispense: 30 tablet; Refill:  0 - QUEtiapine (SEROQUEL) 50 MG tablet; Take 1 tablet (50 mg total) by mouth at bedtime.  Dispense: 90 tablet; Refill: 0  2. Therapeutic drug monitoring  - Comprehensive Metabolic Panel (CMET) - CBC with Differential - Lipid Profile  Patient location: home Provider location: [facility]/home office  Persons involved in the visit: patient, provider   Interactive audio and video communications were attempted, although failed due to patient's inability to connect to video. Continued visit with audio only interaction with patient agreement.    Follow up plan: Return in about 4 months (around 10/19/2018) for CPE and f/u.  Osvaldo AngstAdriana Pollak, PA-C [facility]  [facility] 06/19/2018, 8:24 AM

## 2018-09-06 ENCOUNTER — Other Ambulatory Visit: Payer: Self-pay | Admitting: Physician Assistant

## 2018-09-06 DIAGNOSIS — Z3041 Encounter for surveillance of contraceptive pills: Secondary | ICD-10-CM

## 2018-10-09 ENCOUNTER — Ambulatory Visit: Payer: BC Managed Care – PPO | Admitting: Physician Assistant

## 2018-10-09 ENCOUNTER — Other Ambulatory Visit: Payer: Self-pay

## 2018-10-09 ENCOUNTER — Encounter: Payer: Self-pay | Admitting: Physician Assistant

## 2018-10-09 VITALS — BP 110/60 | HR 83 | Temp 97.5°F | Resp 16 | Ht 68.5 in | Wt 226.2 lb

## 2018-10-09 DIAGNOSIS — Z114 Encounter for screening for human immunodeficiency virus [HIV]: Secondary | ICD-10-CM | POA: Diagnosis not present

## 2018-10-09 DIAGNOSIS — G4700 Insomnia, unspecified: Secondary | ICD-10-CM | POA: Diagnosis not present

## 2018-10-09 DIAGNOSIS — Z0000 Encounter for general adult medical examination without abnormal findings: Secondary | ICD-10-CM | POA: Diagnosis not present

## 2018-10-09 MED ORDER — QUETIAPINE FUMARATE 50 MG PO TABS: 50.0000 mg | ORAL_TABLET | Freq: Every day | ORAL | 0 refills | Status: DC

## 2018-10-09 NOTE — Progress Notes (Signed)
Patient: Diana Reed, Female    DOB: 04-16-1995, 23 y.o.   MRN: [id number] Visit Date: 10/09/2018  Today's Provider: Trinna Post, PA-C   Chief Complaint  Patient presents with  . Annual Exam   Subjective:    Annual physical exam Diana Reed is a 23 y.o. female who presents today for health maintenance and complete physical. She feels well. She reports exercising. She reports she is sleeping well. Was not working for a brief period due to [place] but she has since started working again. In the interim, she has gotten a Holiday representative.   She is taking lo-loestrin FE 1 mg. She has not had periods since starting this. She denies history of blood clots, stroke, migraine with aura.   The clobetasol topical foam is working well for the itchy patches on her scalp.  She continues with seroquel 50 mg once before bed and this is working well for insomnia as well as her mood. She would like to continue.   BP Readings from Last 3 Encounters:  10/09/18 110/60  05/15/18 124/82  01/02/18 110/80   Wt Readings from Last 3 Encounters:  10/09/18 226 lb 3.2 oz (102.6 kg)  05/15/18 224 lb (101.6 kg)  01/02/18 212 lb 6.4 oz (96.3 kg)    Last Reported Pap- 10/05/17 Tdap-10/05/17  -----------------------------------------------------------------   Review of Systems  All other systems reviewed and are negative.   Social History She  reports that she has never smoked. She has never used smokeless tobacco. She reports that she does not drink alcohol or use drugs. Social History   Socioeconomic History  . Marital status: Single    Spouse name: Not on file  . Number of children: Not on file  . Years of education: Not on file  . Highest education level: Not on file  Occupational History  . Occupation: Full Immunologist  . Occupation: Part time employee  Social Needs  . Financial resource strain: Not on file  . Food insecurity    Worry: Not on file    Inability: Not on  file  . Transportation needs    Medical: Not on file    Non-medical: Not on file  Tobacco Use  . Smoking status: Never Smoker  . Smokeless tobacco: Never Used  Substance and Sexual Activity  . Alcohol use: No  . Drug use: No  . Sexual activity: Never    Birth control/protection: Pill  Lifestyle  . Physical activity    Days per week: Not on file    Minutes per session: Not on file  . Stress: Not on file  Relationships  . Social Herbalist on phone: Not on file    Gets together: Not on file    Attends religious service: Not on file    Active member of club or organization: Not on file    Attends meetings of clubs or organizations: Not on file    Relationship status: Not on file  Other Topics Concern  . Not on file  Social History Narrative  . Not on file    Patient Active Problem List   Diagnosis Date Noted  . Right knee pain 08/15/2016    Past Surgical History:  Procedure Laterality Date  . WISDOM TOOTH EXTRACTION      Family History  Family Status  Relation Name Status  . Mother  Alive  . Father  Alive  . Brother  Alive  . MGM  Alive  .  PGM  Alive  . Brother  Alive  . Mat Aunt  Alive   Her family history includes Breast cancer in her paternal grandmother; Cervical cancer in her maternal aunt; Colon cancer in her maternal grandmother; Diabetes in her maternal grandmother; Healthy in her brother, brother, and father.     No Known Allergies  Previous Medications   CLOBETASOL (OLUX) 0.05 % TOPICAL FOAM    Use twice daily for one week on scalp during flare.   HYDROXYZINE (ATARAX/VISTARIL) 25 MG TABLET    TAKE 3 TABLETS BY MOUTH EVERY DAY AT BEDTIME   LO LOESTRIN FE 1 MG-10 MCG / 10 MCG TABLET    TAKE 1 TABLET DAILY    Patient Care Team: Maryella ShiversPollak, Adriana M, PA-C as PCP - General (Physician Assistant)      Objective:   Vitals: BP 110/60   Pulse 83   Temp (!) 97.5 F (36.4 C) (Oral)   Resp 16   Ht 5' 8.5" (1.74 m)   Wt 226 lb 3.2 oz (102.6  kg)   BMI 33.89 kg/m    Physical Exam Constitutional:      Appearance: Normal appearance.  Cardiovascular:     Rate and Rhythm: Normal rate and regular rhythm.     Heart sounds: Normal heart sounds.  Pulmonary:     Effort: Pulmonary effort is normal.     Breath sounds: Normal breath sounds.  Abdominal:     General: Abdomen is flat. Bowel sounds are normal.     Palpations: Abdomen is soft.  Neurological:     Mental Status: She is alert and oriented to person, place, and time. Mental status is at baseline.  Psychiatric:        Mood and Affect: Mood normal.        Behavior: Behavior normal.      Depression Screen PHQ 2/9 Scores 10/09/2018 10/09/2018 10/05/2017 08/15/2016  PHQ - 2 Score 0 1 1 1   PHQ- 9 Score - 5 7 -      Assessment & Plan:     Routine Health Maintenance and Physical Exam  Exercise Activities and Dietary recommendations Goals   None     Immunization History  Administered Date(s) Administered  . Tdap 10/05/2017    Health Maintenance  Topic Date Due  . HIV Screening  09/21/2011  . INFLUENZA VACCINE  09/28/2018  . PAP-Cervical Cytology Screening  10/05/2020  . PAP SMEAR-Modifier  10/05/2020  . TETANUS/TDAP  10/06/2027     Discussed health benefits of physical activity, and encouraged her to engage in regular exercise appropriate for her age and condition.    1. Annual physical exam  Refill OCP as needed.   - Comprehensive Metabolic Panel (CMET) - CBC with Differential - HIV antibody (with reflex)  2. Encounter for screening for HIV  - HIV antibody (with reflex)  3. Insomnia, unspecified type  Continue. Labs as above to monitor for metabolic syndrome.   - QUEtiapine (SEROQUEL) 50 MG tablet; Take 1 tablet (50 mg total) by mouth at bedtime.  Dispense: 90 tablet; Refill: 0  Follow up 1 year CPE and f/u.  The entirety of the information documented in the History of Present Illness, Review of Systems and Physical Exam were personally  obtained by me. Portions of this information were initially documented by Sheliah HatchKathleen Wolford, CMA and reviewed by me for thoroughness and accuracy.    --------------------------------------------------------------------

## 2018-10-09 NOTE — Patient Instructions (Signed)
Health Maintenance, Female Adopting a healthy lifestyle and getting preventive care are important in promoting health and wellness. Ask your health care provider about:  The right schedule for you to have regular tests and exams.  Things you can do on your own to prevent diseases and keep yourself healthy. What should I know about diet, weight, and exercise? Eat a healthy diet   Eat a diet that includes plenty of vegetables, fruits, low-fat dairy products, and lean protein.  Do not eat a lot of foods that are high in solid fats, added sugars, or sodium. Maintain a healthy weight Body mass index (BMI) is used to identify weight problems. It estimates body fat based on height and weight. Your health care provider can help determine your BMI and help you achieve or maintain a healthy weight. Get regular exercise Get regular exercise. This is one of the most important things you can do for your health. Most adults should:  Exercise for at least 150 minutes each week. The exercise should increase your heart rate and make you sweat (moderate-intensity exercise).  Do strengthening exercises at least twice a week. This is in addition to the moderate-intensity exercise.  Spend less time sitting. Even light physical activity can be beneficial. Watch cholesterol and blood lipids Have your blood tested for lipids and cholesterol at 22 years of age, then have this test every 5 years. Have your cholesterol levels checked more often if:  Your lipid or cholesterol levels are high.  You are older than 22 years of age.  You are at high risk for heart disease. What should I know about cancer screening? Depending on your health history and family history, you may need to have cancer screening at various ages. This may include screening for:  Breast cancer.  Cervical cancer.  Colorectal cancer.  Skin cancer.  Lung cancer. What should I know about heart disease, diabetes, and high blood  pressure? Blood pressure and heart disease  High blood pressure causes heart disease and increases the risk of stroke. This is more likely to develop in people who have high blood pressure readings, are of African descent, or are overweight.  Have your blood pressure checked: ? Every 3-5 years if you are 18-39 years of age. ? Every year if you are 40 years old or older. Diabetes Have regular diabetes screenings. This checks your fasting blood sugar level. Have the screening done:  Once every three years after age 40 if you are at a normal weight and have a low risk for diabetes.  More often and at a younger age if you are overweight or have a high risk for diabetes. What should I know about preventing infection? Hepatitis B If you have a higher risk for hepatitis B, you should be screened for this virus. Talk with your health care provider to find out if you are at risk for hepatitis B infection. Hepatitis C Testing is recommended for:  Everyone born from 1945 through 1965.  Anyone with known risk factors for hepatitis C. Sexually transmitted infections (STIs)  Get screened for STIs, including gonorrhea and chlamydia, if: ? You are sexually active and are younger than 22 years of age. ? You are older than 22 years of age and your health care provider tells you that you are at risk for this type of infection. ? Your sexual activity has changed since you were last screened, and you are at increased risk for chlamydia or gonorrhea. Ask your health care provider if   you are at risk.  Ask your health care provider about whether you are at high risk for HIV. Your health care provider may recommend a prescription medicine to help prevent HIV infection. If you choose to take medicine to prevent HIV, you should first get tested for HIV. You should then be tested every 3 months for as long as you are taking the medicine. Pregnancy  If you are about to stop having your period (premenopausal) and  you may become pregnant, seek counseling before you get pregnant.  Take 400 to 800 micrograms (mcg) of folic acid every day if you become pregnant.  Ask for birth control (contraception) if you want to prevent pregnancy. Osteoporosis and menopause Osteoporosis is a disease in which the bones lose minerals and strength with aging. This can result in bone fractures. If you are 65 years old or older, or if you are at risk for osteoporosis and fractures, ask your health care provider if you should:  Be screened for bone loss.  Take a calcium or vitamin D supplement to lower your risk of fractures.  Be given hormone replacement therapy (HRT) to treat symptoms of menopause. Follow these instructions at home: Lifestyle  Do not use any products that contain nicotine or tobacco, such as cigarettes, e-cigarettes, and chewing tobacco. If you need help quitting, ask your health care provider.  Do not use street drugs.  Do not share needles.  Ask your health care provider for help if you need support or information about quitting drugs. Alcohol use  Do not drink alcohol if: ? Your health care provider tells you not to drink. ? You are pregnant, may be pregnant, or are planning to become pregnant.  If you drink alcohol: ? Limit how much you use to 0-1 drink a day. ? Limit intake if you are breastfeeding.  Be aware of how much alcohol is in your drink. In the [place] one drink equals one 12 oz bottle of beer (355 mL), one 5 oz glass of wine (148 mL), or one 1 oz glass of hard liquor (44 mL). General instructions  Schedule regular health, dental, and eye exams.  Stay current with your vaccines.  Tell your health care provider if: ? You often feel depressed. ? You have ever been abused or do not feel safe at home. Summary  Adopting a healthy lifestyle and getting preventive care are important in promoting health and wellness.  Follow your health care provider's instructions about healthy  diet, exercising, and getting tested or screened for diseases.  Follow your health care provider's instructions on monitoring your cholesterol and blood pressure. This information is not intended to replace advice given to you by your health care provider. Make sure you discuss any questions you have with your health care provider. Document Released: 08/29/2010 Document Revised: 02/06/2018 Document Reviewed: 02/06/2018 Elsevier Patient Education  2020 Elsevier Inc.  

## 2018-10-10 LAB — CBC WITH DIFFERENTIAL/PLATELET
Basophils Absolute: 0 10*3/uL (ref 0.0–0.2)
Basos: 1 %
EOS (ABSOLUTE): 0.1 10*3/uL (ref 0.0–0.4)
Eos: 1 %
Hematocrit: 39.6 % (ref 34.0–46.6)
Hemoglobin: 13 g/dL (ref 11.1–15.9)
Immature Grans (Abs): 0 10*3/uL (ref 0.0–0.1)
Immature Granulocytes: 0 %
Lymphocytes Absolute: 2.4 10*3/uL (ref 0.7–3.1)
Lymphs: 28 %
MCH: 27.5 pg (ref 26.6–33.0)
MCHC: 32.8 g/dL (ref 31.5–35.7)
MCV: 84 fL (ref 79–97)
Monocytes Absolute: 0.6 10*3/uL (ref 0.1–0.9)
Monocytes: 7 %
Neutrophils Absolute: 5.4 10*3/uL (ref 1.4–7.0)
Neutrophils: 63 %
Platelets: 300 10*3/uL (ref 150–450)
RBC: 4.73 x10E6/uL (ref 3.77–5.28)
RDW: 12.4 % (ref 11.7–15.4)
WBC: 8.5 10*3/uL (ref 3.4–10.8)

## 2018-10-10 LAB — COMPREHENSIVE METABOLIC PANEL
ALT: 14 IU/L (ref 0–32)
AST: 15 IU/L (ref 0–40)
Albumin/Globulin Ratio: 1.5 (ref 1.2–2.2)
Albumin: 4 g/dL (ref 3.9–5.0)
Alkaline Phosphatase: 48 IU/L (ref 39–117)
BUN/Creatinine Ratio: 11 (ref 9–23)
BUN: 8 mg/dL (ref 6–20)
Bilirubin Total: 0.3 mg/dL (ref 0.0–1.2)
CO2: 20 mmol/L (ref 20–29)
Calcium: 8.9 mg/dL (ref 8.7–10.2)
Chloride: 104 mmol/L (ref 96–106)
Creatinine, Ser: 0.72 mg/dL (ref 0.57–1.00)
GFR calc Af Amer: 137 mL/min/{1.73_m2} (ref >59)
GFR calc non Af Amer: 119 mL/min/{1.73_m2} (ref >59)
Globulin, Total: 2.6 g/dL (ref 1.5–4.5)
Glucose: 80 mg/dL (ref 65–99)
Potassium: 3.9 mmol/L (ref 3.5–5.2)
Sodium: 139 mmol/L (ref 134–144)
Total Protein: 6.6 g/dL (ref 6.0–8.5)

## 2018-10-10 LAB — HIV ANTIBODY (ROUTINE TESTING W REFLEX): HIV Screen 4th Generation wRfx: NONREACTIVE

## 2019-02-24 ENCOUNTER — Other Ambulatory Visit: Payer: Self-pay | Admitting: Physician Assistant

## 2019-02-24 DIAGNOSIS — Z3041 Encounter for surveillance of contraceptive pills: Secondary | ICD-10-CM

## 2019-02-24 MED ORDER — HYDROXYZINE HCL 25 MG PO TABS: ORAL_TABLET | ORAL | 2 refills | Status: DC

## 2019-02-24 MED ORDER — LO LOESTRIN FE 1 MG-10 MCG / 10 MCG PO TABS: 1.0000 | ORAL_TABLET | Freq: Every day | ORAL | 3 refills | Status: DC

## 2019-02-24 NOTE — Telephone Encounter (Signed)
Requested medication (s) are due for refill today: yes  Requested medication (s) are on the active medication list: yes  Last refill: 05/15/2018  Future visit scheduled: no  Notes to clinic: Last filled by historical provider    Requested Prescriptions  Pending Prescriptions Disp Refills   hydrOXYzine (ATARAX/VISTARIL) 25 MG tablet 30 tablet     Sig: TAKE 3 TABLETS BY MOUTH EVERY DAY AT BEDTIME      Ear, Nose, and Throat:  Antihistamines Passed - 02/24/2019 11:38 AM      Passed - Valid encounter within last 12 months    Recent Outpatient Visits           4 months ago Annual physical exam   [facility], Adriana M, PA-C   8 months ago Insomnia, unspecified type   [facility] Car[place] M, PA-C   9 months ago Insomnia, unspecified type   [facility] [place], Wendee Beavers, [place]   1 year ago Rash of face   [place], Wendee Beavers, [place]   1 year ago Annual physical exam   [facility] [place] Carles Collet M, [place]               Signed Prescriptions Disp Refills   Norethindrone-Ethinyl Estradiol-Fe Biphas (LO LOESTRIN FE) 1 MG-10 MCG / 10 MCG tablet 84 tablet 3    Sig: Take 1 tablet by mouth daily.      OB/GYN:  Contraceptives Passed - 02/24/2019 11:38 AM      Passed - Last BP in normal range    BP Readings from Last 1 Encounters:  10/09/18 110/60          Passed - Valid encounter within last 12 months    Recent Outpatient Visits           4 months ago Annual physical exam   [place], [place], PA-C   8 months ago Insomnia, unspecified type   [place], [place], [place]   9 months ago Insomnia, unspecified type   [facility] [place], Wendee Beavers, [place]   1 year ago Rash of face   [facility] [place], Wendee Beavers, [place]   1 year ago Annual physical exam   [place], [place], [place]

## 2019-02-24 NOTE — Telephone Encounter (Signed)
Medication Refill - Medication:  LO LOESTRIN FE 1 MG-10 MCG / 10 MCG tablet  hydrOXYzine (ATARAX/VISTARIL) 25 MG tablet     Has the patient contacted their pharmacy? No. (Agent: If no, request that the patient contact the pharmacy for the refill.) (Agent: If yes, when and what did the pharmacy advise?)  Preferred Pharmacy (with phone number or street name): pharmacy has changed.  [facility]   Agent: Please be advised that RX refills may take up to 3 business days. We ask that you follow-up with your pharmacy.

## 2019-06-04 ENCOUNTER — Encounter: Payer: Self-pay | Admitting: Physician Assistant

## 2019-06-04 ENCOUNTER — Other Ambulatory Visit: Payer: Self-pay

## 2019-06-04 ENCOUNTER — Ambulatory Visit (INDEPENDENT_AMBULATORY_CARE_PROVIDER_SITE_OTHER): Payer: BC Managed Care – PPO | Admitting: Physician Assistant

## 2019-06-04 VITALS — BP 134/81 | HR 94 | Temp 97.3°F | Wt 240.0 lb

## 2019-06-04 DIAGNOSIS — R21 Rash and other nonspecific skin eruption: Secondary | ICD-10-CM | POA: Diagnosis not present

## 2019-06-04 MED ORDER — MUPIROCIN CALCIUM 2 % EX CREA: 1.0000 "application " | TOPICAL_CREAM | Freq: Two times a day (BID) | CUTANEOUS | 0 refills | Status: DC

## 2019-06-04 NOTE — Patient Instructions (Signed)
Rash, Adult  A rash is a change in the color of your skin. A rash can also change the way your skin feels. There are many different conditions and factors that can cause a rash. Follow these instructions at home: The goal of treatment is to stop the itching and keep the rash from spreading. Watch for any changes in your symptoms. Let your doctor know about them. Follow these instructions to help with your condition: Medicine Take or apply over-the-counter and prescription medicines only as told by your doctor. These may include medicines:  To treat red or swollen skin (corticosteroid creams).  To treat itching.  To treat an allergy (oral antihistamines).  To treat very bad symptoms (oral corticosteroids).  Skin care  Put cool cloths (compresses) on the affected areas.  Do not scratch or rub your skin.  Avoid covering the rash. Make sure that the rash is exposed to air as much as possible. Managing itching and discomfort  Avoid hot showers or baths. These can make itching worse. A cold shower may help.  Try taking a bath with: ? Epsom salts. You can get these at your local pharmacy or grocery store. Follow the instructions on the package. ? Baking soda. Pour a small amount into the bath as told by your doctor. ? Colloidal oatmeal. You can get this at your local pharmacy or grocery store. Follow the instructions on the package.  Try putting baking soda paste onto your skin. Stir water into baking soda until it gets like a paste.  Try putting on a lotion that relieves itchiness (calamine lotion).  Keep cool and out of the sun. Sweating and being hot can make itching worse. General instructions   Rest as needed.  Drink enough fluid to keep your pee (urine) pale yellow.  Wear loose-fitting clothing.  Avoid scented soaps, detergents, and perfumes. Use gentle soaps, detergents, perfumes, and other cosmetic products.  Avoid anything that causes your rash. Keep a journal to  help track what causes your rash. Write down: ? What you eat. ? What cosmetic products you use. ? What you drink. ? What you wear. This includes jewelry.  Keep all follow-up visits as told by your doctor. This is important. Contact a doctor if:  You sweat at night.  You lose weight.  You pee (urinate) more than normal.  You pee less than normal, or you notice that your pee is a darker color than normal.  You feel weak.  You throw up (vomit).  Your skin or the whites of your eyes look yellow (jaundice).  Your skin: ? Tingles. ? Is numb.  Your rash: ? Does not go away after a few days. ? Gets worse.  You are: ? More thirsty than normal. ? More tired than normal.  You have: ? New symptoms. ? Pain in your belly (abdomen). ? A fever. ? Watery poop (diarrhea). Get help right away if:  You have a fever and your symptoms suddenly get worse.  You start to feel mixed up (confused).  You have a very bad headache or a stiff neck.  You have very bad joint pains or stiffness.  You have jerky movements that you cannot control (seizure).  Your rash covers all or most of your body. The rash may or may not be painful.  You have blisters that: ? Are on top of the rash. ? Grow larger. ? Grow together. ? Are painful. ? Are inside your nose or mouth.  You have a rash   that: ? Looks like purple pinprick-sized spots all over your body. ? Has a "bull's eye" or looks like a target. ? Is red and painful, causes your skin to peel, and is not from being in the sun too long. Summary  A rash is a change in the color of your skin. A rash can also change the way your skin feels.  The goal of treatment is to stop the itching and keep the rash from spreading.  Take or apply over-the-counter and prescription medicines only as told by your doctor.  Contact a doctor if you have new symptoms or symptoms that get worse.  Keep all follow-up visits as told by your doctor. This is  important. This information is not intended to replace advice given to you by your health care provider. Make sure you discuss any questions you have with your health care provider. Document Revised: 06/07/2018 Document Reviewed: 09/17/2017 Elsevier Patient Education  2020 Elsevier Inc.  

## 2019-06-04 NOTE — Progress Notes (Signed)
       Patient: Diana Reed Female    DOB: 05/29/1995   23 y.o.   MRN: [id number] Visit Date: 06/04/2019  Today's Provider: Trey Sailors, PA-C   Chief Complaint  Patient presents with  . Rash   Subjective:     Rash This is a recurrent problem. The problem is unchanged. Location: Bilateral breasts. The rash is characterized by redness. She was exposed to nothing. Past treatments include antibiotic cream. The treatment provided mild relief.   Had a spot on her right breast several months ago that looked like a scab but she doesn't not remember injury. The lesion continued to scab despite her not touching it and now has scarred. There is a similar lesion on her left breast currently.  Wt Readings from Last 3 Encounters:  06/04/19 240 lb (108.9 kg)  10/09/18 226 lb 3.2 oz (102.6 kg)  05/15/18 224 lb (101.6 kg)     No Known Allergies   Current Outpatient Medications:  .  clobetasol (OLUX) 0.05 % topical foam, Use twice daily for one week on scalp during flare., Disp: 50 g, Rfl: 0 .  hydrOXYzine (ATARAX/VISTARIL) 25 MG tablet, TAKE 3 TABLETS BY MOUTH EVERY DAY AT BEDTIME, Disp: 30 tablet, Rfl: 2 .  Norethindrone-Ethinyl Estradiol-Fe Biphas (LO LOESTRIN FE) 1 MG-10 MCG / 10 MCG tablet, Take 1 tablet by mouth daily., Disp: 84 tablet, Rfl: 3 .  QUEtiapine (SEROQUEL) 50 MG tablet, Take 1 tablet (50 mg total) by mouth at bedtime., Disp: 90 tablet, Rfl: 0  Review of Systems  Constitutional: Negative.   Musculoskeletal: Negative.   Skin: Positive for rash. Negative for color change, pallor and wound.    Social History   Tobacco Use  . Smoking status: Never Smoker  . Smokeless tobacco: Never Used  Substance Use Topics  . Alcohol use: No      Objective:   BP 134/81 (BP Location: Left Arm, Patient Position: Sitting, Cuff Size: Large)   Pulse 94   Temp (!) 97.3 F (36.3 C) (Temporal)   Wt 240 lb (108.9 kg)   BMI 35.96 kg/m  Vitals:   06/04/19 1541  BP: 134/81    Pulse: 94  Temp: (!) 97.3 F (36.3 C)  TempSrc: Temporal  Weight: 240 lb (108.9 kg)  Body mass index is 35.96 kg/m.   Physical Exam Constitutional:      Appearance: Normal appearance.  Chest:    Neurological:     Mental Status: She is alert.      No results found for any visits on 06/04/19.     Assessment & Plan    1. Rash  Possibly some folliculitis. Likely self limiting. Treat below as needed.  - mupirocin cream (BACTROBAN) 2 %; Apply 1 application topically 2 (two) times daily.  Dispense: 15 g; Refill: 0  The entirety of the information documented in the History of Present Illness, Review of Systems and Physical Exam were personally obtained by me. Portions of this information were initially documented by [facility] and reviewed by me for thoroughness and accuracy.       Trey Sailors, PA-C  [facility]

## 2019-11-28 ENCOUNTER — Other Ambulatory Visit: Payer: Self-pay | Admitting: Physician Assistant

## 2019-11-28 DIAGNOSIS — Z3041 Encounter for surveillance of contraceptive pills: Secondary | ICD-10-CM

## 2019-11-28 NOTE — Telephone Encounter (Signed)
Requested  medications are  due for refill today yes  Requested medications are on the active medication list yes  Last refill 8/12  Last visit 09/2018  Future visit scheduled no  Notes to clinic Failed protocol of visit within 12 months, please assess.

## 2020-01-07 ENCOUNTER — Encounter (HOSPITAL_COMMUNITY): Payer: Self-pay | Admitting: Orthopedic Surgery

## 2020-01-07 ENCOUNTER — Other Ambulatory Visit: Payer: Self-pay

## 2020-01-07 NOTE — Anesthesia Preprocedure Evaluation (Addendum)
Anesthesia Evaluation  Patient identified by MRN, date of birth, ID band Patient awake    Reviewed: Allergy & Precautions, H&P , NPO status , Patient's Chart, lab work & pertinent test results  Airway Mallampati: II  TM Distance: >3 FB Neck ROM: Full    Dental no notable dental hx. (+) Teeth Intact, Dental Advisory Given   Pulmonary neg pulmonary ROS,    Pulmonary exam normal breath sounds clear to auscultation       Cardiovascular Exercise Tolerance: Good negative cardio ROS Normal cardiovascular exam Rhythm:Regular Rate:Normal     Neuro/Psych negative neurological ROS  negative psych ROS   GI/Hepatic negative GI ROS, Neg liver ROS,   Endo/Other  Morbid obesity  Renal/GU negative Renal ROS  negative genitourinary   Musculoskeletal negative musculoskeletal ROS (+)   Abdominal (+) + obese,   Peds negative pediatric ROS (+)  Hematology negative hematology ROS (+)   Anesthesia Other Findings   Reproductive/Obstetrics negative OB ROS                            Anesthesia Physical Anesthesia Plan  ASA: II  Anesthesia Plan: General   Post-op Pain Management: GA combined w/ Regional for post-op pain   Induction: Intravenous  PONV Risk Score and Plan: 3 and Ondansetron, Dexamethasone, Treatment may vary due to age or medical condition and Midazolam  Airway Management Planned: Oral ETT and LMA  Additional Equipment:   Intra-op Plan:   Post-operative Plan: Extubation in OR  Informed Consent: I have reviewed the patients History and Physical, chart, labs and discussed the procedure including the risks, benefits and alternatives for the proposed anesthesia with the patient or authorized representative who has indicated his/her understanding and acceptance.     Dental advisory given  Plan Discussed with: Anesthesiologist and CRNA  Anesthesia Plan Comments: (Discussed both nerve  block for pain relief post-op and GA; including NV, sore throat, dental injury, and pulmonary complications)        Anesthesia Quick Evaluation

## 2020-01-07 NOTE — Progress Notes (Signed)
Same Day Work-Up Phone Call:  COVID TEST- DOS  -----------------------------------------------------------------------  Patient Instructions:   Your procedure is scheduled on Thursday, November 11, from 08:00 AM- 11:04 AM.  Report to [facility] Main Entrance "A" at 05:30 A.M., and check in at the Admitting office.  Call this number if you have problems the morning of surgery:  [phone]    Remember:  Do not eat or drink after midnight the night before your surgery.    Take these medicines the morning of surgery with A SIP OF WATER:  IF NEEDED: cyclobenzaprine (FLEXERIL) HYDROcodone-acetaminophen (NORCO/VICODIN) methocarbamol (ROBAXIN) oxyCODONE-acetaminophen (PERCOCET/ROXICET)    As of today, STOP taking any Aspirin (unless otherwise instructed by your surgeon) Aleve, Naproxen, Ibuprofen, Motrin, Advil, Goody's, BC's, all herbal medications, fish oil, and all vitamins.  Day of Surgery: Shower Wear Clean/Comfortable clothing the morning of surgery Do not apply any deodorants/lotions.               Do not wear jewelry, make up, or nail polish Remember to brush your teeth WITH YOUR REGULAR TOOTHPASTE.             Do not shave 48 hours prior to surgery.               Do not bring valuables to the hospital.             [facility] is not responsible for any belongings or valuables.    Do NOT Smoke (Tobacco/Vaping) or drink Alcohol 24 hours prior to your procedure.    Patients discharged the day of surgery will not be allowed to drive home, and someone needs to stay with them for 24 hours.

## 2020-01-08 ENCOUNTER — Ambulatory Visit (HOSPITAL_COMMUNITY)
Admission: RE | Admit: 2020-01-08 | Discharge: 2020-01-08 | Disposition: A | Discharge status: Home / Self Care | Payer: BC Managed Care – PPO | Source: Ambulatory Visit | Attending: Orthopedic Surgery | Admitting: Orthopedic Surgery

## 2020-01-08 ENCOUNTER — Other Ambulatory Visit: Payer: Self-pay

## 2020-01-08 ENCOUNTER — Ambulatory Visit (HOSPITAL_COMMUNITY): Payer: BC Managed Care – PPO

## 2020-01-08 ENCOUNTER — Encounter (HOSPITAL_COMMUNITY)
Admission: RE | Disposition: A | Discharge status: Home / Self Care | Payer: Self-pay | Source: Ambulatory Visit | Attending: Orthopedic Surgery

## 2020-01-08 ENCOUNTER — Ambulatory Visit (HOSPITAL_COMMUNITY): Payer: BC Managed Care – PPO | Admitting: Certified Registered Nurse Anesthetist

## 2020-01-08 ENCOUNTER — Encounter (HOSPITAL_COMMUNITY): Payer: Self-pay | Admitting: Orthopedic Surgery

## 2020-01-08 DIAGNOSIS — Z793 Long term (current) use of hormonal contraceptives: Secondary | ICD-10-CM | POA: Insufficient documentation

## 2020-01-08 DIAGNOSIS — S42352A Displaced comminuted fracture of shaft of humerus, left arm, initial encounter for closed fracture: Secondary | ICD-10-CM | POA: Insufficient documentation

## 2020-01-08 DIAGNOSIS — Z419 Encounter for procedure for purposes other than remedying health state, unspecified: Secondary | ICD-10-CM

## 2020-01-08 DIAGNOSIS — Z20822 Contact with and (suspected) exposure to covid-19: Secondary | ICD-10-CM | POA: Insufficient documentation

## 2020-01-08 DIAGNOSIS — T148XXA Other injury of unspecified body region, initial encounter: Secondary | ICD-10-CM

## 2020-01-08 HISTORY — DX: Psoriasis, unspecified: L40.9

## 2020-01-08 HISTORY — PX: ORIF HUMERUS FRACTURE: SHX2126

## 2020-01-08 LAB — POCT PREGNANCY, URINE: Preg Test, Ur: NEGATIVE

## 2020-01-08 LAB — CBC WITH DIFFERENTIAL/PLATELET
Abs Immature Granulocytes: 0.04 10*3/uL (ref 0.00–0.07)
Basophils Absolute: 0.1 10*3/uL (ref 0.0–0.1)
Basophils Relative: 1 %
Eosinophils Absolute: 0.1 10*3/uL (ref 0.0–0.5)
Eosinophils Relative: 1 %
HCT: 35.7 % — ABNORMAL LOW (ref 36.0–46.0)
Hemoglobin: 11.7 g/dL — ABNORMAL LOW (ref 12.0–15.0)
Immature Granulocytes: 0 %
Lymphocytes Relative: 18 %
Lymphs Abs: 2 10*3/uL (ref 0.7–4.0)
MCH: 28.3 pg (ref 26.0–34.0)
MCHC: 32.8 g/dL (ref 30.0–36.0)
MCV: 86.2 fL (ref 80.0–100.0)
Monocytes Absolute: 0.9 10*3/uL (ref 0.1–1.0)
Monocytes Relative: 8 %
Neutro Abs: 8.2 10*3/uL — ABNORMAL HIGH (ref 1.7–7.7)
Neutrophils Relative %: 72 %
Platelets: 415 10*3/uL — ABNORMAL HIGH (ref 150–400)
RBC: 4.14 MIL/uL (ref 3.87–5.11)
RDW: 11.9 % (ref 11.5–15.5)
WBC: 11.3 10*3/uL — ABNORMAL HIGH (ref 4.0–10.5)
nRBC: 0 % (ref 0.0–0.2)

## 2020-01-08 LAB — COMPREHENSIVE METABOLIC PANEL
ALT: 21 U/L (ref 0–44)
AST: 19 U/L (ref 15–41)
Albumin: 3.3 g/dL — ABNORMAL LOW (ref 3.5–5.0)
Alkaline Phosphatase: 43 U/L (ref 38–126)
Anion gap: 10 (ref 5–15)
BUN: 9 mg/dL (ref 6–20)
CO2: 23 mmol/L (ref 22–32)
Calcium: 8.8 mg/dL — ABNORMAL LOW (ref 8.9–10.3)
Chloride: 103 mmol/L (ref 98–111)
Creatinine, Ser: 0.73 mg/dL (ref 0.44–1.00)
GFR, Estimated: >60 mL/min (ref >60)
Glucose, Bld: 90 mg/dL (ref 70–99)
Potassium: 3.7 mmol/L (ref 3.5–5.1)
Sodium: 136 mmol/L (ref 135–145)
Total Bilirubin: 0.5 mg/dL (ref 0.3–1.2)
Total Protein: 6.9 g/dL (ref 6.5–8.1)

## 2020-01-08 LAB — URINALYSIS, ROUTINE W REFLEX MICROSCOPIC
Bilirubin Urine: NEGATIVE
Glucose, UA: NEGATIVE mg/dL
Hgb urine dipstick: NEGATIVE
Ketones, ur: NEGATIVE mg/dL
Leukocytes,Ua: NEGATIVE
Nitrite: NEGATIVE
Protein, ur: NEGATIVE mg/dL
Specific Gravity, Urine: 1.02 (ref 1.005–1.030)
pH: 5 (ref 5.0–8.0)

## 2020-01-08 LAB — SURGICAL PCR SCREEN
MRSA, PCR: NEGATIVE
Staphylococcus aureus: NEGATIVE

## 2020-01-08 LAB — PROTIME-INR
INR: 1 (ref 0.8–1.2)
Prothrombin Time: 12.6 seconds (ref 11.4–15.2)

## 2020-01-08 LAB — SARS CORONAVIRUS 2 BY RT PCR (HOSPITAL ORDER, PERFORMED IN ~~LOC~~ HOSPITAL LAB): SARS Coronavirus 2: NEGATIVE

## 2020-01-08 LAB — APTT: aPTT: 30 seconds (ref 24–36)

## 2020-01-08 SURGERY — OPEN REDUCTION INTERNAL FIXATION (ORIF) HUMERAL SHAFT FRACTURE
Anesthesia: General | Laterality: Left

## 2020-01-08 MED ORDER — CHLORHEXIDINE GLUCONATE 4 % EX LIQD: 60.0000 mL | Freq: Once | CUTANEOUS | Status: DC

## 2020-01-08 MED ORDER — ONDANSETRON HCL 4 MG/2ML IJ SOLN
INTRAMUSCULAR | Status: AC
  Filled 2020-01-08: qty 2

## 2020-01-08 MED ORDER — ONDANSETRON HCL 4 MG/2ML IJ SOLN
INTRAMUSCULAR | Status: DC | PRN
  Administered 2020-01-08: 4 mg via INTRAVENOUS

## 2020-01-08 MED ORDER — POVIDONE-IODINE 10 % EX SWAB
2.0000 "application " | Freq: Once | CUTANEOUS | Status: AC
  Administered 2020-01-08: 2 via TOPICAL

## 2020-01-08 MED ORDER — ONDANSETRON HCL 4 MG/2ML IJ SOLN
4.0000 mg | Freq: Once | INTRAMUSCULAR | Status: AC | PRN
  Administered 2020-01-08: 4 mg via INTRAVENOUS

## 2020-01-08 MED ORDER — FENTANYL CITRATE (PF) 250 MCG/5ML IJ SOLN
INTRAMUSCULAR | Status: DC | PRN
  Administered 2020-01-08 (×2): 50 ug via INTRAVENOUS

## 2020-01-08 MED ORDER — FENTANYL CITRATE (PF) 250 MCG/5ML IJ SOLN
INTRAMUSCULAR | Status: AC
  Filled 2020-01-08: qty 5

## 2020-01-08 MED ORDER — 0.9 % SODIUM CHLORIDE (POUR BTL) OPTIME
TOPICAL | Status: DC | PRN
  Administered 2020-01-08: 1000 mL

## 2020-01-08 MED ORDER — MEPERIDINE HCL 25 MG/ML IJ SOLN: 6.2500 mg | INTRAMUSCULAR | Status: DC | PRN

## 2020-01-08 MED ORDER — ONDANSETRON 4 MG PO TBDP: 4.0000 mg | ORAL_TABLET | Freq: Three times a day (TID) | ORAL | 0 refills | Status: DC | PRN

## 2020-01-08 MED ORDER — SUGAMMADEX SODIUM 200 MG/2ML IV SOLN
INTRAVENOUS | Status: DC | PRN
  Administered 2020-01-08: 200 mg via INTRAVENOUS

## 2020-01-08 MED ORDER — FENTANYL CITRATE (PF) 100 MCG/2ML IJ SOLN: 100.0000 ug | Freq: Once | INTRAMUSCULAR | Status: AC

## 2020-01-08 MED ORDER — DEXAMETHASONE SODIUM PHOSPHATE 10 MG/ML IJ SOLN
INTRAMUSCULAR | Status: DC | PRN
  Administered 2020-01-08: 4 mg via INTRAVENOUS

## 2020-01-08 MED ORDER — ROCURONIUM BROMIDE 10 MG/ML (PF) SYRINGE
PREFILLED_SYRINGE | INTRAVENOUS | Status: AC
  Filled 2020-01-08: qty 20

## 2020-01-08 MED ORDER — MIDAZOLAM HCL 5 MG/5ML IJ SOLN
INTRAMUSCULAR | Status: DC | PRN
  Administered 2020-01-08: 1 mg via INTRAVENOUS

## 2020-01-08 MED ORDER — CHLORHEXIDINE GLUCONATE 0.12 % MT SOLN
OROMUCOSAL | Status: AC
  Administered 2020-01-08: 15 mL
  Filled 2020-01-08: qty 15

## 2020-01-08 MED ORDER — MIDAZOLAM HCL 2 MG/2ML IJ SOLN
INTRAMUSCULAR | Status: AC
  Administered 2020-01-08: 2 mg via INTRAVENOUS
  Filled 2020-01-08: qty 2

## 2020-01-08 MED ORDER — ALBUMIN HUMAN 5 % IV SOLN: INTRAVENOUS | Status: DC | PRN

## 2020-01-08 MED ORDER — ORAL CARE MOUTH RINSE: 15.0000 mL | Freq: Once | OROMUCOSAL | Status: DC

## 2020-01-08 MED ORDER — OXYCODONE HCL 5 MG PO TABS
5.0000 mg | ORAL_TABLET | Freq: Three times a day (TID) | ORAL | 0 refills | Status: DC | PRN
Start: 2020-01-08 — End: 2020-04-08

## 2020-01-08 MED ORDER — BUPIVACAINE-EPINEPHRINE (PF) 0.5% -1:200000 IJ SOLN
INTRAMUSCULAR | Status: DC | PRN
  Administered 2020-01-08: 20 mL via PERINEURAL

## 2020-01-08 MED ORDER — BUPIVACAINE LIPOSOME 1.3 % IJ SUSP
INTRAMUSCULAR | Status: DC | PRN
  Administered 2020-01-08: 10 mL via PERINEURAL

## 2020-01-08 MED ORDER — PROPOFOL 10 MG/ML IV BOLUS
INTRAVENOUS | Status: AC
  Filled 2020-01-08: qty 40

## 2020-01-08 MED ORDER — DEXAMETHASONE SODIUM PHOSPHATE 10 MG/ML IJ SOLN
INTRAMUSCULAR | Status: AC
  Filled 2020-01-08: qty 1

## 2020-01-08 MED ORDER — KETOROLAC TROMETHAMINE 10 MG PO TABS: 10.0000 mg | ORAL_TABLET | Freq: Four times a day (QID) | ORAL | 0 refills | Status: DC | PRN

## 2020-01-08 MED ORDER — ROCURONIUM BROMIDE 10 MG/ML (PF) SYRINGE
PREFILLED_SYRINGE | INTRAVENOUS | Status: DC | PRN
  Administered 2020-01-08: 20 mg via INTRAVENOUS
  Administered 2020-01-08: 30 mg via INTRAVENOUS
  Administered 2020-01-08: 20 mg via INTRAVENOUS
  Administered 2020-01-08: 70 mg via INTRAVENOUS

## 2020-01-08 MED ORDER — ACETAMINOPHEN 160 MG/5ML PO SOLN: 325.0000 mg | ORAL | Status: DC | PRN

## 2020-01-08 MED ORDER — LACTATED RINGERS IV SOLN: INTRAVENOUS | Status: DC

## 2020-01-08 MED ORDER — PROPOFOL 10 MG/ML IV BOLUS
INTRAVENOUS | Status: DC | PRN
  Administered 2020-01-08: 150 mg via INTRAVENOUS

## 2020-01-08 MED ORDER — ACETAMINOPHEN 325 MG PO TABS: 325.0000 mg | ORAL_TABLET | ORAL | Status: DC | PRN

## 2020-01-08 MED ORDER — FENTANYL CITRATE (PF) 100 MCG/2ML IJ SOLN: 25.0000 ug | INTRAMUSCULAR | Status: DC | PRN

## 2020-01-08 MED ORDER — CHLORHEXIDINE GLUCONATE 0.12 % MT SOLN: 15.0000 mL | Freq: Once | OROMUCOSAL | Status: DC

## 2020-01-08 MED ORDER — MIDAZOLAM HCL 2 MG/2ML IJ SOLN: 2.0000 mg | Freq: Once | INTRAMUSCULAR | Status: AC

## 2020-01-08 MED ORDER — OXYCODONE HCL 5 MG PO TABS: 5.0000 mg | ORAL_TABLET | Freq: Once | ORAL | Status: DC | PRN

## 2020-01-08 MED ORDER — OXYCODONE HCL 5 MG/5ML PO SOLN: 5.0000 mg | Freq: Once | ORAL | Status: DC | PRN

## 2020-01-08 MED ORDER — DEXTROSE 5 % IV SOLN
3.0000 g | INTRAVENOUS | Status: AC
  Administered 2020-01-08: 3 g via INTRAVENOUS
  Filled 2020-01-08: qty 3

## 2020-01-08 MED ORDER — MIDAZOLAM HCL 2 MG/2ML IJ SOLN
INTRAMUSCULAR | Status: AC
  Filled 2020-01-08: qty 2

## 2020-01-08 MED ORDER — FENTANYL CITRATE (PF) 100 MCG/2ML IJ SOLN
INTRAMUSCULAR | Status: AC
  Administered 2020-01-08: 100 ug via INTRAVENOUS
  Filled 2020-01-08: qty 2

## 2020-01-08 SURGICAL SUPPLY — 67 items
BIT DRILL 100X2XQC STRL (BIT) ×1 IMPLANT
BIT DRILL 110MM 85MM (BIT) ×1 IMPLANT
BIT DRILL QC 2.0X100 (BIT) ×2
BIT DRILL QC 2.8X135 (BIT) ×3 IMPLANT
BIT DRILL QC SFS 2.5X170 (BIT) ×3 IMPLANT
BIT DRL 100X2XQC STRL (BIT) ×1
BRUSH SCRUB EZ PLAIN DRY (MISCELLANEOUS) ×6 IMPLANT
COVER SURGICAL LIGHT HANDLE (MISCELLANEOUS) ×3 IMPLANT
DRAPE C-ARM 42X72 X-RAY (DRAPES) ×3 IMPLANT
DRAPE ORTHO SPLIT 77X108 STRL (DRAPES) ×4
DRAPE SURG ORHT 6 SPLT 77X108 (DRAPES) ×2 IMPLANT
DRILL BIT 110MM/85MM (BIT) ×2
DRSG MEPILEX BORDER 4X12 (GAUZE/BANDAGES/DRESSINGS) ×3 IMPLANT
ELECT REM PT RETURN 9FT ADLT (ELECTROSURGICAL) ×3
ELECTRODE REM PT RTRN 9FT ADLT (ELECTROSURGICAL) ×1 IMPLANT
GLOVE BIO SURGEON STRL SZ7.5 (GLOVE) ×3 IMPLANT
GLOVE BIO SURGEON STRL SZ8 (GLOVE) ×3 IMPLANT
GLOVE BIOGEL PI IND STRL 7.5 (GLOVE) ×1 IMPLANT
GLOVE BIOGEL PI IND STRL 8 (GLOVE) ×1 IMPLANT
GLOVE BIOGEL PI INDICATOR 7.5 (GLOVE) ×2
GLOVE BIOGEL PI INDICATOR 8 (GLOVE) ×2
GOWN STRL REUS W/ TWL LRG LVL3 (GOWN DISPOSABLE) ×2 IMPLANT
GOWN STRL REUS W/ TWL XL LVL3 (GOWN DISPOSABLE) ×1 IMPLANT
GOWN STRL REUS W/TWL LRG LVL3 (GOWN DISPOSABLE) ×4
GOWN STRL REUS W/TWL XL LVL3 (GOWN DISPOSABLE) ×2
KIT BASIN OR (CUSTOM PROCEDURE TRAY) ×3 IMPLANT
KIT TURNOVER KIT B (KITS) ×3 IMPLANT
MANIFOLD NEPTUNE II (INSTRUMENTS) ×3 IMPLANT
NS IRRIG 1000ML POUR BTL (IV SOLUTION) ×3 IMPLANT
PACK ORTHO EXTREMITY (CUSTOM PROCEDURE TRAY) ×3 IMPLANT
PLATE LC DCP 2.0 6H (Plate) ×3 IMPLANT
PROS LCP PLATE 14 189M (Plate) ×3 IMPLANT
PROSTHESIS LCP PLATE 14 189M (Plate) ×1 IMPLANT
SCREW CORTEX 2.0 24MM (Screw) ×6 IMPLANT
SCREW CORTEX 2.0 26MM (Screw) ×6 IMPLANT
SCREW CORTEX 2.0 28MM (Screw) ×3 IMPLANT
SCREW CORTEX 3.5 20MM (Screw) ×2 IMPLANT
SCREW CORTEX 3.5 22MM (Screw) ×2 IMPLANT
SCREW CORTEX 3.5 26MM (Screw) ×2 IMPLANT
SCREW CORTEX 3.5 28MM (Screw) ×2 IMPLANT
SCREW CORTEX 3.5 30MM (Screw) ×2 IMPLANT
SCREW CORTEX ST 2.0X12 (Screw) ×6 IMPLANT
SCREW LOCK CORT ST 3.5X20 (Screw) ×1 IMPLANT
SCREW LOCK CORT ST 3.5X22 (Screw) ×1 IMPLANT
SCREW LOCK CORT ST 3.5X26 (Screw) ×1 IMPLANT
SCREW LOCK CORT ST 3.5X28 (Screw) ×1 IMPLANT
SCREW LOCK CORT ST 3.5X30 (Screw) ×1 IMPLANT
SCREW LOCK T15 FT 10X3.5X2.9X (Screw) ×1 IMPLANT
SCREW LOCK T15 FT 24X3.5X2.9X (Screw) ×3 IMPLANT
SCREW LOCK T15 FT 28X3.5X2.9X (Screw) ×2 IMPLANT
SCREW LOCKING 3.5X10 (Screw) ×2 IMPLANT
SCREW LOCKING 3.5X24 (Screw) ×6 IMPLANT
SCREW LOCKING 3.5X28 (Screw) ×4 IMPLANT
SPONGE LAP 18X18 RF (DISPOSABLE) ×3 IMPLANT
STAPLER VISISTAT 35W (STAPLE) ×3 IMPLANT
SUT ETHILON 3 0 PS 1 (SUTURE) ×3 IMPLANT
SUT PDS AB 2-0 CT1 27 (SUTURE) ×3 IMPLANT
SUT VIC AB 0 CT1 27 (SUTURE) ×2
SUT VIC AB 0 CT1 27XBRD ANBCTR (SUTURE) ×1 IMPLANT
SUT VIC AB 2-0 CT1 27 (SUTURE) ×4
SUT VIC AB 2-0 CT1 TAPERPNT 27 (SUTURE) ×2 IMPLANT
SYR 5ML LL (SYRINGE) IMPLANT
TOWEL GREEN STERILE (TOWEL DISPOSABLE) ×9 IMPLANT
TOWEL GREEN STERILE FF (TOWEL DISPOSABLE) ×3 IMPLANT
TUBE CONNECTING 12'X1/4 (SUCTIONS) ×1
TUBE CONNECTING 12X1/4 (SUCTIONS) ×2 IMPLANT
YANKAUER SUCT BULB TIP NO VENT (SUCTIONS) ×3 IMPLANT
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
## 2020-01-08 NOTE — Anesthesia Postprocedure Evaluation (Signed)
Anesthesia Post Note  Patient: Diana Reed  Procedure(s) Performed: OPEN REDUCTION INTERNAL FIXATION (ORIF) HUMERAL SHAFT FRACTURE (Left )     Patient location during evaluation: PACU Anesthesia Type: General Level of consciousness: awake and alert Pain management: pain level controlled Vital Signs Assessment: post-procedure vital signs reviewed and stable Respiratory status: spontaneous breathing, nonlabored ventilation, respiratory function stable and patient connected to nasal cannula oxygen Cardiovascular status: blood pressure returned to baseline and stable Postop Assessment: no apparent nausea or vomiting Anesthetic complications: no   No complications documented.  Last Vitals:  Vitals:   01/08/20 1240 01/08/20 1255  BP: 121/70 132/76  Pulse: (!) 109 (!) 104  Resp: (!) 24 15  Temp:  (!) 36.2 C  SpO2: 97% 97%    Last Pain:  Vitals:   01/08/20 1255  TempSrc:   PainSc: 0-No pain                 Dravon Nott

## 2020-01-08 NOTE — Discharge Instructions (Signed)
[facility]thopa[facility] Trauma Service Discharge Instructions   General Discharge Instructions  Orthopaedic Injuries:  Left humerus fracture treated with open reduction and internal fixation using plate and screws   WEIGHT BEARING STATUS: No lifting with left arm greater than 5 lbs  RANGE OF MOTION/ACTIVITY: no active shoulder abduction (chicken wing motion).  Ok to move shoulder forward and back (flexion and extension). Unrestricted motion of elbow, forearm, wrist and hand.  Sling for comfort. Come out of sling several times a day to move elbow to prevent stiffness   Wound Care: daily wound care starting on 01/11/2020. See below   Discharge Wound Care Instructions  Do NOT apply any ointments, solutions or lotions to pin sites or surgical wounds.  These prevent needed drainage and even though solutions like hydrogen peroxide kill bacteria, they also damage cells lining the pin sites that help fight infection.  Applying lotions or ointments can keep the wounds moist and can cause them to breakdown and open up as well. This can increase the risk for infection. When in doubt call the office.  Surgical incisions should be dressed daily.  If any drainage is noted, use one layer of adaptic, then gauze, and tape. Alternatively you can use a mepilex type dressing (beige dressing you currently have on)  Once the incision is completely dry and without drainage, it may be left open to air out.  Showering may begin 36-48 hours later.  Cleaning gently with soap and water.  Diet: as you were eating previously.  Can use over the counter stool softeners and bowel preparations, such as Miralax, to help with bowel movements.  Narcotics can be constipating.  Be sure to drink plenty of fluids  PAIN MEDICATION USE AND EXPECTATIONS  You have likely been given narcotic medications to help control your pain.  After a traumatic event that results in an fracture (broken bone) with or without surgery, it is ok to use  narcotic pain medications to help control one's pain.  We understand that everyone responds to pain differently and each individual patient will be evaluated on a regular basis for the continued need for narcotic medications. Ideally, narcotic medication use should last no more than 6-8 weeks (coinciding with fracture healing).   As a patient it is your responsibility as well to monitor narcotic medication use and report the amount and frequency you use these medications when you come to your office visit.   We would also advise that if you are using narcotic medications, you should take a dose prior to therapy to maximize you participation.  IF YOU ARE ON NARCOTIC MEDICATIONS IT IS NOT PERMISSIBLE TO OPERATE A MOTOR VEHICLE (MOTORCYCLE/CAR/TRUCK/MOPED) OR HEAVY MACHINERY DO NOT MIX NARCOTICS WITH OTHER CNS (CENTRAL NERVOUS SYSTEM) DEPRESSANTS SUCH AS ALCOHOL   STOP SMOKING OR USING NICOTINE PRODUCTS!!!!  As discussed nicotine severely impairs your body's ability to heal surgical and traumatic wounds but also impairs bone healing.  Wounds and bone heal by forming microscopic blood vessels (angiogenesis) and nicotine is a vasoconstrictor (essentially, shrinks blood vessels).  Therefore, if vasoconstriction occurs to these microscopic blood vessels they essentially disappear and are unable to deliver necessary nutrients to the healing tissue.  This is one modifiable factor that you can do to dramatically increase your chances of healing your injury.    (This means no smoking, no nicotine gum, patches, etc)  DO NOT USE NONSTEROIDAL ANTI-INFLAMMATORY DRUGS (NSAID'S)  Using products such as Advil (ibuprofen), Aleve (naproxen), Motrin (ibuprofen) for additional pain control  during fracture healing can delay and/or prevent the healing response.  If you would like to take over the counter (OTC) medication, Tylenol (acetaminophen) is ok.  However, some narcotic medications that are given for pain control contain  acetaminophen as well. Therefore, you should not exceed more than 4000 mg of tylenol in a day if you do not have liver disease.  Also note that there are may OTC medicines, such as cold medicines and allergy medicines that my contain tylenol as well.  If you have any questions about medications and/or interactions please ask your doctor/PA or your pharmacist.      ICE AND ELEVATE INJURED/OPERATIVE EXTREMITY  Using ice and elevating the injured extremity above your heart can help with swelling and pain control.  Icing in a pulsatile fashion, such as 20 minutes on and 20 minutes off, can be followed.    Do not place ice directly on skin. Make sure there is a barrier between to skin and the ice pack.    Using frozen items such as frozen peas works well as the conform nicely to the are that needs to be iced.  USE AN ACE WRAP OR TED HOSE FOR SWELLING CONTROL  In addition to icing and elevation, Ace wraps or TED hose are used to help limit and resolve swelling.  It is recommended to use Ace wraps or TED hose until you are informed to stop.    When using Ace Wraps start the wrapping distally (farthest away from the body) and wrap proximally (closer to the body)   Example: If you had surgery on your leg or thing and you do not have a splint on, start the ace wrap at the toes and work your way up to the thigh        If you had surgery on your upper extremity and do not have a splint on, start the ace wrap at your fingers and work your way up to the upper arm  IF YOU ARE IN A SPLINT OR CAST DO NOT REMOVE IT FOR ANY REASON   If your splint gets wet for any reason please contact the office immediately. You may shower in your splint or cast as long as you keep it dry.  This can be done by wrapping in a cast cover or garbage back (or similar)  Do Not stick any thing down your splint or cast such as pencils, money, or hangers to try and scratch yourself with.  If you feel itchy take benadryl as prescribed on the  bottle for itching  IF YOU ARE IN A CAM BOOT (BLACK BOOT)  You may remove boot periodically. Perform daily dressing changes as noted below.  Wash the liner of the boot regularly and wear a sock when wearing the boot. It is recommended that you sleep in the boot until told otherwise    Call office for the following:  Temperature greater than 101F  Persistent nausea and vomiting  Severe uncontrolled pain  Redness, tenderness, or signs of infection (pain, swelling, redness, odor or green/yellow discharge around the site)  Difficulty breathing, headache or visual disturbances  Hives  Persistent dizziness or light-headedness  Extreme fatigue  Any other questions or concerns you may have after discharge  In an emergency, call 911 or go to an Emergency Department at a nearby hospital  HELPFUL INFORMATION  ? If you had a block, it will wear off between 8-24 hrs postop typically.  This is period when your pain may  go from nearly zero to the pain you would have had postop without the block.  This is an abrupt transition but nothing dangerous is happening.  You may take an extra dose of narcotic when this happens.  ? You should wean off your narcotic medicines as soon as you are able.  Most patients will be off or using minimal narcotics before their first postop appointment.   ? We suggest you use the pain medication the first night prior to going to bed, in order to ease any pain when the anesthesia wears off. You should avoid taking pain medications on an empty stomach as it will make you nauseous.  ? Do not drink alcoholic beverages or take illicit drugs when taking pain medications.  ? In most states it is against the law to drive while you are in a splint or sling.  And certainly against the law to drive while taking narcotics.  ? You may return to work/school in the next couple of days when you feel up to it.   ? Pain medication may make you constipated.  Below are a few  solutions to try in this order: - Decrease the amount of pain medication if you aren't having pain. - Drink lots of decaffeinated fluids. - Drink prune juice and/or each dried prunes  o If the first 3 don't work start with additional solutions - Take Colace - an over-the-counter stool softener - Take Senokot - an over-the-counter laxative - Take Miralax - a stronger over-the-counter laxative     CALL THE OFFICE WITH ANY QUESTIONS OR CONCERNS: [phone]   VISIT OUR WEBSITE FOR ADDITIONAL INFORMATION: [email or web address]

## 2020-01-08 NOTE — Anesthesia Procedure Notes (Signed)
Procedure Name: Intubation Date/Time: 01/08/2020 8:36 AM Performed by: Colin Benton, CRNA Pre-anesthesia Checklist: Patient identified, Emergency Drugs available, Suction available and Patient being monitored Patient Re-evaluated:Patient Re-evaluated prior to induction Oxygen Delivery Method: Circle system utilized Preoxygenation: Pre-oxygenation with 100% oxygen Induction Type: IV induction Ventilation: Mask ventilation without difficulty and Oral airway inserted - appropriate to patient size Laryngoscope Size: Mac and 3 Grade View: Grade I Tube type: Oral Tube size: 7.0 mm Number of attempts: 1 Airway Equipment and Method: Stylet and Oral airway Placement Confirmation: ETT inserted through vocal cords under direct vision,  positive ETCO2 and breath sounds checked- equal and bilateral Secured at: 23 cm Tube secured with: Tape Dental Injury: Teeth and Oropharynx as per pre-operative assessment

## 2020-01-08 NOTE — Transfer of Care (Signed)
Immediate Anesthesia Transfer of Care Note  Patient: Adrijana Haros  Procedure(s) Performed: OPEN REDUCTION INTERNAL FIXATION (ORIF) HUMERAL SHAFT FRACTURE (Left )  Patient Location: PACU  Anesthesia Type:GA combined with regional for post-op pain  Level of Consciousness: drowsy  Airway & Oxygen Therapy: Patient Spontanous Breathing and Patient connected to nasal cannula oxygen  Post-op Assessment: Report given to RN and Post -op Vital signs reviewed and stable  Post vital signs: Reviewed and stable  Last Vitals:  Vitals Value Taken Time  BP    Temp    Pulse 101 01/08/20 1140  Resp 19 01/08/20 1140  SpO2 99 % 01/08/20 1140  Vitals shown include unvalidated device data.  Last Pain:  Vitals:   01/08/20 0730  TempSrc:   PainSc: 0-No pain      Patients Stated Pain Goal: 3 (01/08/20 0720)  Complications: No complications documented.

## 2020-01-08 NOTE — Anesthesia Procedure Notes (Signed)
Anesthesia Regional Block: Supraclavicular block   Pre-Anesthetic Checklist: ,, timeout performed, Correct Patient, Correct Site, Correct Laterality, Correct Procedure, Correct Position, site marked, Risks and benefits discussed,  Surgical consent,  Pre-op evaluation,  At surgeon's request and post-op pain management  Laterality: Left  Prep: chloraprep       Needles:  Injection technique: Single-shot  Needle Type: Echogenic Stimulator Needle     Needle Length: 5cm  Needle Gauge: 22     Additional Needles:   Procedures:, nerve stimulator,,, ultrasound used (permanent image in chart),,,,   Nerve Stimulator or Paresthesia:  Response: hand, 0.45 mA,   Additional Responses:   Narrative:  Start time: 01/08/2020 8:22 AM End time: 01/08/2020 8:27 AM Injection made incrementally with aspirations every 5 mL.  Performed by: Personally  Anesthesiologist: Bethena Midget, MD  Additional Notes: Functioning IV was confirmed and monitors were applied.  A 27mm 22ga Arrow echogenic stimulator needle was used. Sterile prep and drape,hand hygiene and sterile gloves were used. Ultrasound guidance: relevant anatomy identified, needle position confirmed, local anesthetic spread visualized around nerve(s)., vascular puncture avoided.  Image printed for medical record. Negative aspiration and negative test dose prior to incremental administration of local anesthetic. The patient tolerated the procedure well.
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
## 2020-01-09 ENCOUNTER — Encounter (HOSPITAL_COMMUNITY): Payer: Self-pay | Admitting: Orthopedic Surgery

## 2020-04-08 ENCOUNTER — Ambulatory Visit (INDEPENDENT_AMBULATORY_CARE_PROVIDER_SITE_OTHER): Payer: BC Managed Care – PPO | Admitting: Physician Assistant

## 2020-04-08 ENCOUNTER — Other Ambulatory Visit (HOSPITAL_COMMUNITY)
Admission: RE | Admit: 2020-04-08 | Discharge: 2020-04-08 | Disposition: A | Discharge status: Home / Self Care | Payer: BC Managed Care – PPO | Source: Ambulatory Visit | Attending: Physician Assistant | Admitting: Physician Assistant

## 2020-04-08 ENCOUNTER — Other Ambulatory Visit: Payer: Self-pay

## 2020-04-08 ENCOUNTER — Encounter: Payer: Self-pay | Admitting: Physician Assistant

## 2020-04-08 VITALS — BP 135/76 | HR 102 | Temp 98.5°F | Ht 68.0 in | Wt 252.1 lb

## 2020-04-08 DIAGNOSIS — Z0000 Encounter for general adult medical examination without abnormal findings: Secondary | ICD-10-CM | POA: Insufficient documentation

## 2020-04-08 DIAGNOSIS — Z124 Encounter for screening for malignant neoplasm of cervix: Secondary | ICD-10-CM | POA: Insufficient documentation

## 2020-04-08 DIAGNOSIS — Z113 Encounter for screening for infections with a predominantly sexual mode of transmission: Secondary | ICD-10-CM | POA: Insufficient documentation

## 2020-04-08 DIAGNOSIS — R87610 Atypical squamous cells of undetermined significance on cytologic smear of cervix (ASC-US): Secondary | ICD-10-CM | POA: Diagnosis not present

## 2020-04-08 DIAGNOSIS — Z1159 Encounter for screening for other viral diseases: Secondary | ICD-10-CM

## 2020-04-08 DIAGNOSIS — Z30011 Encounter for initial prescription of contraceptive pills: Secondary | ICD-10-CM

## 2020-04-08 NOTE — Progress Notes (Signed)
Complete physical exam   Patient: Diana Reed   DOB: 1995-05-28   24 y.o. Female  MRN: [id number] Visit Date: 04/08/2020  Today's healthcare provider: Trey Sailors, PA-C   Chief Complaint  Patient presents with  . Annual Exam  I,Miking Usrey M Nyelah Emmerich,acting as a scribe for Trey Sailors, PA-C.,have documented all relevant documentation on the behalf of Trey Sailors, PA-C,as directed by  Trey Sailors, PA-C while in the presence of Trey Sailors, PA-C.  Subjective    Diana Reed is a 25 y.o. female who presents today for a complete physical exam.  She reports consuming a general diet. The patient has a physically strenuous job, but has no regular exercise apart from work.  She generally feels well. She reports sleeping fairly well. She does not have additional problems to discuss today.  HPI   Currently taking OCP.  Past Medical History:  Diagnosis Date  . Psoriasis    Past Surgical History:  Procedure Laterality Date  . ORIF HUMERUS FRACTURE Left 01/08/2020   Procedure: OPEN REDUCTION INTERNAL FIXATION (ORIF) HUMERAL SHAFT FRACTURE;  Surgeon: Myrene Galas, MD;  Location: [facility] OR;  Service: Orthopedics;  Laterality: Left;  . WISDOM TOOTH EXTRACTION     Social History   Socioeconomic History  . Marital status: Single    Spouse name: Not on file  . Number of children: Not on file  . Years of education: Not on file  . Highest education level: Not on file  Occupational History  . Occupation: Full Neurosurgeon  . Occupation: Part time employee  Tobacco Use  . Smoking status: Never Smoker  . Smokeless tobacco: Never Used  Vaping Use  . Vaping Use: Never used  Substance and Sexual Activity  . Alcohol use: No  . Drug use: No  . Sexual activity: Never    Birth control/protection: Pill  Other Topics Concern  . Not on file  Social History Narrative  . Not on file   Social Determinants of Health   Financial Resource Strain: Not on file   Food Insecurity: Not on file  Transportation Needs: Not on file  Physical Activity: Not on file  Stress: Not on file  Social Connections: Not on file  Intimate Partner Violence: Not on file   Family Status  Relation Name Status  . Mother  Alive  . Father  Alive  . Brother  Alive  . MGM  Alive  . PGM  Alive  . Brother  Alive  . Mat Aunt  Alive   Family History  Problem Relation Age of Onset  . Healthy Father   . Healthy Brother   . Diabetes Maternal Grandmother   . Colon cancer Maternal Grandmother   . Breast cancer Paternal Grandmother   . Healthy Brother   . Cervical cancer Maternal Aunt    Allergies  Allergen Reactions  . Other     Eggplant-swollen,itchy tongue & some swelling/itching of throat    Patient Care Team: Maryella Shivers as PCP - General (Physician Assistant)   Medications: Outpatient Medications Prior to Visit  Medication Sig  . clobetasol (OLUX) 0.05 % topical foam Use twice daily for one week on scalp during flare. (Patient taking differently: Apply 1 application topically daily as needed (psoriasis.).)  . LO LOESTRIN FE 1 MG-10 MCG / 10 MCG tablet TAKE 1 TABLET BY MOUTH  DAILY (Patient taking differently: Take 1 tablet by mouth every evening.)  . [DISCONTINUED] HYDROcodone-acetaminophen (NORCO/VICODIN) 5-325  MG tablet Take 1 tablet by mouth every 4 (four) hours as needed for pain. (Patient not taking: Reported on 04/08/2020)  . [DISCONTINUED] ketorolac (TORADOL) 10 MG tablet Take 1 tablet (10 mg total) by mouth every 6 (six) hours as needed for moderate pain. (Patient not taking: Reported on 04/08/2020)  . [DISCONTINUED] methocarbamol (ROBAXIN) 500 MG tablet Take 500 mg by mouth every 4 (four) hours as needed for spasms. (Patient not taking: Reported on 04/08/2020)  . [DISCONTINUED] ondansetron (ZOFRAN ODT) 4 MG disintegrating tablet Take 1 tablet (4 mg total) by mouth every 8 (eight) hours as needed for nausea or vomiting. (Patient not taking:  Reported on 04/08/2020)  . [DISCONTINUED] oxyCODONE (ROXICODONE) 5 MG immediate release tablet Take 1 tablet (5 mg total) by mouth every 8 (eight) hours as needed for breakthrough pain. Take 1 tablet every 8 hours as needed between norco doses for breakthrough pain only (Patient not taking: Reported on 04/08/2020)   No facility-administered medications prior to visit.    Review of Systems    Objective    BP 135/76 (BP Location: Right Arm, Patient Position: Sitting, Cuff Size: Large)   Pulse (!) 102   Temp 98.5 F (36.9 C) (Oral)   Ht 5\' 8"  (1.727 m)   Wt 252 lb 1.6 oz (114.4 kg)   SpO2 100%   BMI 38.33 kg/m    Physical Exam Constitutional:      Appearance: Normal appearance.  HENT:     Right Ear: Tympanic membrane, ear canal and external ear normal.     Left Ear: Tympanic membrane, ear canal and external ear normal.  Cardiovascular:     Rate and Rhythm: Normal rate and regular rhythm.     Pulses: Normal pulses.     Heart sounds: Normal heart sounds.  Pulmonary:     Effort: Pulmonary effort is normal.     Breath sounds: Normal breath sounds.  Abdominal:     General: Abdomen is flat. Bowel sounds are normal.     Palpations: Abdomen is soft.  Skin:    General: Skin is warm and dry.  Neurological:     General: No focal deficit present.     Mental Status: She is alert and oriented to person, place, and time.  Psychiatric:        Mood and Affect: Mood normal.        Behavior: Behavior normal.       Last depression screening scores PHQ 2/9 Scores 04/08/2020 10/09/2018 10/09/2018  PHQ - 2 Score 1 0 1  PHQ- 9 Score 6 - 5   Last fall risk screening Fall Risk  04/08/2020  Falls in the past year? 0  Number falls in past yr: 0  Injury with Fall? 0  Risk for fall due to : No Fall Risks  Follow up Falls evaluation completed   Last Audit-C alcohol use screening Alcohol Use Disorder Test (AUDIT) 04/08/2020  1. How often do you have a drink containing alcohol? 2  2. How many  drinks containing alcohol do you have on a typical day when you are drinking? 0  3. How often do you have six or more drinks on one occasion? 1  AUDIT-C Score 3  4. How often during the last year have you found that you were not able to stop drinking once you had started? 0  5. How often during the last year have you failed to do what was normally expected from you because of drinking? 0  6.  How often during the last year have you needed a first drink in the morning to get yourself going after a heavy drinking session? 0  7. How often during the last year have you had a feeling of guilt of remorse after drinking? 0  8. How often during the last year have you been unable to remember what happened the night before because you had been drinking? 0  9. Have you or someone else been injured as a result of your drinking? 0  10. Has a relative or friend or a doctor or another health worker been concerned about your drinking or suggested you cut down? 0  Alcohol Use Disorder Identification Test Final Score (AUDIT) 3   A score of 3 or more in women, and 4 or more in men indicates increased risk for alcohol abuse, EXCEPT if all of the points are from question 1   No results found for any visits on 04/08/20.  Assessment & Plan    Routine Health Maintenance and Physical Exam  Exercise Activities and Dietary recommendations Goals   None     Immunization History  Administered Date(s) Administered  . Tdap 10/05/2017    Health Maintenance  Topic Date Due  . Hepatitis C Screening  Never done  . COVID-19 Vaccine (1) Never done  . INFLUENZA VACCINE  05/27/2020 (Originally 09/28/2019)  . PAP-Cervical Cytology Screening  10/05/2020  . PAP SMEAR-Modifier  10/05/2020  . TETANUS/TDAP  10/06/2027  . HIV Screening  Completed    Discussed health benefits of physical activity, and encouraged her to engage in regular exercise appropriate for her age and condition.  1. Annual physical exam  - TSH -  Lipid panel - Comprehensive metabolic panel - CBC with Differential/Platelet - Cytology - PAP  2. Need for hepatitis C screening test  - Hepatitis C antibody  3. Screening for cervical cancer   4. OCP (oral contraceptive pills) initiation    Return in about 1 year (around 04/08/2021) for cpe .     ITrey Sailors, PA-C, have reviewed all documentation for this visit. The documentation on 04/08/20 for the exam, diagnosis, procedures, and orders are all accurate and complete.  The entirety of the information documented in the History of Present Illness, Review of Systems and Physical Exam were personally obtained by me. Portions of this information were initially documented by [facility] and reviewed by me for thoroughness and accuracy.     Maryella Shivers  [facility] [phone] (phone) [phone] (fax)  [facility]

## 2020-04-08 NOTE — Patient Instructions (Signed)
Health Maintenance, Female Adopting a healthy lifestyle and getting preventive care are important in promoting health and wellness. Ask your health care provider about:  The right schedule for you to have regular tests and exams.  Things you can do on your own to prevent diseases and keep yourself healthy. What should I know about diet, weight, and exercise? Eat a healthy diet  Eat a diet that includes plenty of vegetables, fruits, low-fat dairy products, and lean protein.  Do not eat a lot of foods that are high in solid fats, added sugars, or sodium.   Maintain a healthy weight Body mass index (BMI) is used to identify weight problems. It estimates body fat based on height and weight. Your health care provider can help determine your BMI and help you achieve or maintain a healthy weight. Get regular exercise Get regular exercise. This is one of the most important things you can do for your health. Most adults should:  Exercise for at least 150 minutes each week. The exercise should increase your heart rate and make you sweat (moderate-intensity exercise).  Do strengthening exercises at least twice a week. This is in addition to the moderate-intensity exercise.  Spend less time sitting. Even light physical activity can be beneficial. Watch cholesterol and blood lipids Have your blood tested for lipids and cholesterol at 24 years of age, then have this test every 5 years. Have your cholesterol levels checked more often if:  Your lipid or cholesterol levels are high.  You are older than 24 years of age.  You are at high risk for heart disease. What should I know about cancer screening? Depending on your health history and family history, you may need to have cancer screening at various ages. This may include screening for:  Breast cancer.  Cervical cancer.  Colorectal cancer.  Skin cancer.  Lung cancer. What should I know about heart disease, diabetes, and high blood  pressure? Blood pressure and heart disease  High blood pressure causes heart disease and increases the risk of stroke. This is more likely to develop in people who have high blood pressure readings, are of African descent, or are overweight.  Have your blood pressure checked: ? Every 3-5 years if you are 18-39 years of age. ? Every year if you are 40 years old or older. Diabetes Have regular diabetes screenings. This checks your fasting blood sugar level. Have the screening done:  Once every three years after age 40 if you are at a normal weight and have a low risk for diabetes.  More often and at a younger age if you are overweight or have a high risk for diabetes. What should I know about preventing infection? Hepatitis B If you have a higher risk for hepatitis B, you should be screened for this virus. Talk with your health care provider to find out if you are at risk for hepatitis B infection. Hepatitis C Testing is recommended for:  Everyone born from 1945 through 1965.  Anyone with known risk factors for hepatitis C. Sexually transmitted infections (STIs)  Get screened for STIs, including gonorrhea and chlamydia, if: ? You are sexually active and are younger than 24 years of age. ? You are older than 24 years of age and your health care provider tells you that you are at risk for this type of infection. ? Your sexual activity has changed since you were last screened, and you are at increased risk for chlamydia or gonorrhea. Ask your health care provider   if you are at risk.  Ask your health care provider about whether you are at high risk for HIV. Your health care provider may recommend a prescription medicine to help prevent HIV infection. If you choose to take medicine to prevent HIV, you should first get tested for HIV. You should then be tested every 3 months for as long as you are taking the medicine. Pregnancy  If you are about to stop having your period (premenopausal) and  you may become pregnant, seek counseling before you get pregnant.  Take 400 to 800 micrograms (mcg) of folic acid every day if you become pregnant.  Ask for birth control (contraception) if you want to prevent pregnancy. Osteoporosis and menopause Osteoporosis is a disease in which the bones lose minerals and strength with aging. This can result in bone fractures. If you are 65 years old or older, or if you are at risk for osteoporosis and fractures, ask your health care provider if you should:  Be screened for bone loss.  Take a calcium or vitamin D supplement to lower your risk of fractures.  Be given hormone replacement therapy (HRT) to treat symptoms of menopause. Follow these instructions at home: Lifestyle  Do not use any products that contain nicotine or tobacco, such as cigarettes, e-cigarettes, and chewing tobacco. If you need help quitting, ask your health care provider.  Do not use street drugs.  Do not share needles.  Ask your health care provider for help if you need support or information about quitting drugs. Alcohol use  Do not drink alcohol if: ? Your health care provider tells you not to drink. ? You are pregnant, may be pregnant, or are planning to become pregnant.  If you drink alcohol: ? Limit how much you use to 0-1 drink a day. ? Limit intake if you are breastfeeding.  Be aware of how much alcohol is in your drink. In the [place] one drink equals one 12 oz bottle of beer (355 mL), one 5 oz glass of wine (148 mL), or one 1 oz glass of hard liquor (44 mL). General instructions  Schedule regular health, dental, and eye exams.  Stay current with your vaccines.  Tell your health care provider if: ? You often feel depressed. ? You have ever been abused or do not feel safe at home. Summary  Adopting a healthy lifestyle and getting preventive care are important in promoting health and wellness.  Follow your health care provider's instructions about healthy  diet, exercising, and getting tested or screened for diseases.  Follow your health care provider's instructions on monitoring your cholesterol and blood pressure. This information is not intended to replace advice given to you by your health care provider. Make sure you discuss any questions you have with your health care provider. Document Revised: 02/06/2018 Document Reviewed: 02/06/2018 Elsevier Patient Education  2021 Elsevier Inc.  

## 2020-04-09 LAB — CBC WITH DIFFERENTIAL/PLATELET
Basophils Absolute: 0.1 10*3/uL (ref 0.0–0.2)
Basos: 1 %
EOS (ABSOLUTE): 0 10*3/uL (ref 0.0–0.4)
Eos: 0 %
Hematocrit: 39.9 % (ref 34.0–46.6)
Hemoglobin: 13.3 g/dL (ref 11.1–15.9)
Immature Grans (Abs): 0 10*3/uL (ref 0.0–0.1)
Immature Granulocytes: 0 %
Lymphocytes Absolute: 2.2 10*3/uL (ref 0.7–3.1)
Lymphs: 22 %
MCH: 27 pg (ref 26.6–33.0)
MCHC: 33.3 g/dL (ref 31.5–35.7)
MCV: 81 fL (ref 79–97)
Monocytes Absolute: 0.4 10*3/uL (ref 0.1–0.9)
Monocytes: 4 %
Neutrophils Absolute: 7 10*3/uL (ref 1.4–7.0)
Neutrophils: 73 %
Platelets: 408 10*3/uL (ref 150–450)
RBC: 4.93 x10E6/uL (ref 3.77–5.28)
RDW: 13 % (ref 11.7–15.4)
WBC: 9.7 10*3/uL (ref 3.4–10.8)

## 2020-04-09 LAB — COMPREHENSIVE METABOLIC PANEL
ALT: 10 IU/L (ref 0–32)
AST: 13 IU/L (ref 0–40)
Albumin/Globulin Ratio: 1.3 (ref 1.2–2.2)
Albumin: 4.1 g/dL (ref 3.9–5.0)
Alkaline Phosphatase: 64 IU/L (ref 44–121)
BUN/Creatinine Ratio: 13 (ref 9–23)
BUN: 9 mg/dL (ref 6–20)
Bilirubin Total: 0.3 mg/dL (ref 0.0–1.2)
CO2: 20 mmol/L (ref 20–29)
Calcium: 9.4 mg/dL (ref 8.7–10.2)
Chloride: 106 mmol/L (ref 96–106)
Creatinine, Ser: 0.68 mg/dL (ref 0.57–1.00)
GFR calc Af Amer: 143 mL/min/{1.73_m2} (ref >59)
GFR calc non Af Amer: 124 mL/min/{1.73_m2} (ref >59)
Globulin, Total: 3.2 g/dL (ref 1.5–4.5)
Glucose: 85 mg/dL (ref 65–99)
Potassium: 4.3 mmol/L (ref 3.5–5.2)
Sodium: 142 mmol/L (ref 134–144)
Total Protein: 7.3 g/dL (ref 6.0–8.5)

## 2020-04-09 LAB — LIPID PANEL
Chol/HDL Ratio: 3.5 ratio (ref 0.0–4.4)
Cholesterol, Total: 200 mg/dL — ABNORMAL HIGH (ref 100–199)
HDL: 57 mg/dL (ref >39)
LDL Chol Calc (NIH): 125 mg/dL — ABNORMAL HIGH (ref 0–99)
Triglycerides: 98 mg/dL (ref 0–149)
VLDL Cholesterol Cal: 18 mg/dL (ref 5–40)

## 2020-04-09 LAB — TSH: TSH: 0.856 u[IU]/mL (ref 0.450–4.500)

## 2020-04-09 LAB — HEPATITIS C ANTIBODY: Hep C Virus Ab: 0.1 s/co ratio (ref 0.0–0.9)

## 2020-04-14 LAB — CYTOLOGY - PAP
Chlamydia: NEGATIVE
Comment: NEGATIVE
Comment: NEGATIVE
Comment: NORMAL
Diagnosis: UNDETERMINED — AB
High risk HPV: NEGATIVE
Neisseria Gonorrhea: NEGATIVE

## 2020-05-19 ENCOUNTER — Encounter: Payer: Self-pay | Admitting: Physician Assistant

## 2020-11-22 ENCOUNTER — Telehealth: Payer: Self-pay | Admitting: Physician Assistant

## 2020-11-22 ENCOUNTER — Other Ambulatory Visit: Payer: Self-pay

## 2020-11-22 DIAGNOSIS — Z3041 Encounter for surveillance of contraceptive pills: Secondary | ICD-10-CM

## 2020-11-22 MED ORDER — LO LOESTRIN FE 1 MG-10 MCG / 10 MCG PO TABS: 1.0000 | ORAL_TABLET | Freq: Every day | ORAL | 3 refills | Status: DC

## 2020-11-22 NOTE — Telephone Encounter (Signed)
[facility] faxed refill request for the following medications:   LO LOESTRIN FE 1 MG-10 MCG / 10 MCG tablet   Please advise.

## 2021-03-30 ENCOUNTER — Other Ambulatory Visit: Payer: Self-pay | Admitting: Family Medicine

## 2021-03-30 DIAGNOSIS — Z3041 Encounter for surveillance of contraceptive pills: Secondary | ICD-10-CM

## 2021-03-30 NOTE — Telephone Encounter (Signed)
Requested medication (s) are due for refill today: Yes  Requested medication (s) are on the active medication list: Yes  Last refill:  11/22/20  Future visit scheduled: No  Notes to clinic:  Is pt. Still seen at the practice?    Requested Prescriptions  Pending Prescriptions Disp Refills   Norethindrone-Ethinyl Estradiol-Fe Biphas (LO LOESTRIN FE) 1 MG-10 MCG / 10 MCG tablet 84 tablet 3    Sig: Take 1 tablet by mouth daily.     OB/GYN:  Contraceptives Passed - 03/30/2021 11:16 AM      Passed - Last BP in normal range    BP Readings from Last 1 Encounters:  04/08/20 135/76          Passed - Valid encounter within last 12 months    Recent Outpatient Visits           11 months ago Annual physical exam   [facility] Trey [place], [place]   1 year ago Rash   [facility] [place], [place], [place]   2 years ago Annual physical exam   [facility] Osvaldo Angst M, [place]   2 years ago Insomnia, unspecified type   [facility] [place], [place], [place]   2 years ago Insomnia, unspecified type   [facility] [place], [place], [place]       Future Appointments             In 2 weeks Reola Calkins, Lollie Marrow, NP [employer] [facility], [facility]            Passed - Patient is not a smoker

## 2021-03-30 NOTE — Telephone Encounter (Signed)
Medication Refill - Medication: Norethindrone-Ethinyl Estradiol-Fe Biphas (LO LOESTRIN FE) 1 MG-10 MCG / 10 MCG tablet    Has the patient contacted their pharmacy? Yes.   (Agent: If no, request that the patient contact the pharmacy for the refill. If patient does not wish to contact the pharmacy document the reason why and proceed with request.) (Agent: If yes, when and what did the pharmacy advise?)  Preferred Pharmacy (with phone number or street name):  [place], [place] Phone:  [phone]  Fax:  [phone]     Has the patient been seen for an appointment in the last year OR does the patient have an upcoming appointment? No.  Patient has an appointment with different office for a NP but will run out of birth control before then.   Agent: Please be advised that RX refills may take up to 3 business days. We ask that you follow-up with your pharmacy.

## 2021-04-01 MED ORDER — LO LOESTRIN FE 1 MG-10 MCG / 10 MCG PO TABS: 1.0000 | ORAL_TABLET | Freq: Every day | ORAL | 0 refills | Status: DC

## 2021-04-01 NOTE — Telephone Encounter (Signed)
Has an upcoming appointment with Hyman Hopes [id number]

## 2021-04-05 ENCOUNTER — Telehealth: Payer: Self-pay

## 2021-04-05 NOTE — Telephone Encounter (Signed)
°  Copied from CRM [id number]. Topic: General - Other >> Apr 05, 2021  3:13 PM Gaetana Michaelis A wrote: Reason for CRM: Shoniece with [facility] has called to share that the patient's Norethindrone-Ethinyl Estradiol-Fe Biphas (LO LOESTRIN FE) 1 MG-10 MCG / 10 MCG tablet [[id number]]  is no longer covered by their insurance   The patient's pharmacy would like to know if they'll be switched to an alternative or if a prior authorization will be completed for the medication   Please contact further when available  Ref # [id number]

## 2021-04-08 ENCOUNTER — Encounter: Payer: Self-pay | Admitting: Physician Assistant

## 2021-04-13 ENCOUNTER — Ambulatory Visit: Payer: Self-pay | Admitting: Family Medicine

## 2021-04-13 ENCOUNTER — Encounter: Payer: Self-pay | Admitting: Physician Assistant

## 2021-04-27 ENCOUNTER — Telehealth: Payer: Self-pay | Admitting: Family Medicine

## 2021-04-27 ENCOUNTER — Encounter: Payer: Self-pay | Admitting: Family Medicine

## 2021-04-27 ENCOUNTER — Ambulatory Visit (INDEPENDENT_AMBULATORY_CARE_PROVIDER_SITE_OTHER): Payer: BC Managed Care – PPO | Admitting: Family Medicine

## 2021-04-27 DIAGNOSIS — Z3041 Encounter for surveillance of contraceptive pills: Secondary | ICD-10-CM

## 2021-04-27 MED ORDER — LO LOESTRIN FE 1 MG-10 MCG / 10 MCG PO TABS: 1.0000 | ORAL_TABLET | Freq: Every day | ORAL | 3 refills | Status: DC

## 2021-04-27 NOTE — Progress Notes (Signed)
? ?______________________________________________________________________ ? ?HPI ?Diana Reed is a 26 y.o. female presenting to [facility] today to establish care.  ? ?Patient Care Team: ?Clayborne Dana, NP as PCP - General (Family Medicine) ? ?Health Maintenance  ?Topic Date Due  ? HPV Vaccine (1 - 2-dose series) Never done  ? COVID-19 Vaccine (2 - Booster for Janssen series) 02/18/2020  ? Flu Shot  05/27/2021*  ? Pap Smear  04/09/2023  ? Pap Smear  04/09/2023  ? Tetanus Vaccine  10/06/2027  ? Hepatitis C Screening: USPSTF Recommendation to screen - Ages 41-79 yo.  Completed  ? HIV Screening  Completed  ?*Topic was postponed. The date shown is not the original due date.  ? ? ? ?Concerns today: ?Birth control: She has been doing Lo Loestrin for 7 years. Had a brief period where she had to switch to generic d/t insurance and she did not tolerate (elevated blood pressure, depression, suicidal thoughts, less effective with dysmenorrhea management). She is not sexually active. No history of smoking, blood clots, cancers.  ? ? ? ? ? ? ?______________________________________________________________________ ?PMH ?Past Medical History:  ?Diagnosis Date  ? Allergy   ? Anxiety   ? Depression   ? Psoriasis   ? ? ?ROS ?All review of systems negative except what is listed in the HPI ? ?PHYSICAL EXAM ?Physical Exam ?Vitals reviewed.  ?Constitutional:   ?   Appearance: Normal appearance.  ?HENT:  ?   Head: Normocephalic and atraumatic.  ?Cardiovascular:  ?   Rate and Rhythm: Normal rate and regular rhythm.  ?   Pulses: Normal pulses.  ?   Heart sounds: Normal heart sounds.  ?Pulmonary:  ?   Effort: Pulmonary effort is normal.  ?   Breath sounds: Normal breath sounds.  ?Musculoskeletal:  ?   Cervical back: Normal range of motion and neck supple. No tenderness.  ?Lymphadenopathy:  ?   Cervical: No cervical adenopathy.  ?Skin: ?   General: Skin is warm and dry.  ?Neurological:  ?   General:  No focal deficit present.  ?   Mental Status: She is alert and oriented to person, place, and time. Mental status is at baseline.  ?Psychiatric:     ?   Mood and Affect: Mood normal.     ?   Behavior: Behavior normal.     ?   Thought Content: Thought content normal.     ?   Judgment: Judgment normal.  ? ?______________________________________________________________________ ?ASSESSMENT AND PLAN ? ?1. Encounter for surveillance of contraceptive pills ?Patient did not tolerate generic brand (see HPI). Will send in again and likely have to do PA to get this approved for her. She is agreeable.  ?- LO LOESTRIN FE 1 MG-10 MCG / 10 MCG tablet; Take 1 tablet by mouth daily.  Dispense: 84 tablet; Refill: 3 ? ? ?Establish care ?Education provided today during visit and on AVS for patient to review at home.  ?Diet and Exercise recommendations provided.  ?Current diagnoses and recommendations discussed. ?HM recommendations reviewed with recommendations.  ?- would like pap early (at next physical) given ASCUS and family history  ? ?Outpatient Encounter Medications as of 04/27/2021  ?Medication Sig  ? clobetasol (OLUX) 0.05 % topical foam Use twice daily for one week on scalp during flare. (Patient taking differently: Apply 1 application topically daily as needed (psoriasis.).)  ? LO LOESTRIN FE 1 MG-10 MCG / 10 MCG tablet Take 1 tablet by mouth daily.  ? [DISCONTINUED] Norethindrone-Ethinyl  Estradiol-Fe Biphas (LO LOESTRIN FE) 1 MG-10 MCG / 10 MCG tablet Take 1 tablet by mouth daily.  ? ?No facility-administered encounter medications on file as of 04/27/2021.  ? ? ?Return in about 3 months (around 07/28/2021) for phsyical, labs + pap?. ? ? ? ?Lollie Marrow Reola Calkins, DNP, FNP-C ? ? ?

## 2021-04-27 NOTE — Telephone Encounter (Signed)
Pharmacy states insurance does not cover lo lestrin, and will only cover bilsovi or hailey 24.  Pharmacy can be reached at [phone], ref # [id number]. Please advise.  ?

## 2021-04-27 NOTE — Patient Instructions (Signed)
Thank you for choosing [facility] at [facility] for your Primary Care needs. I am excited for the opportunity to partner with you to meet your health care goals. It was a pleasure meeting you today! ° ° ° °Information on diet, exercise, and health maintenance recommendations are listed below. This is information to help you be sure you are on track for optimal health and monitoring.  ° °Please look over this and let us know if you have any questions or if you have completed any of the health maintenance outside of [facility] so that we can be sure your records are up to date.  °___________________________________________________________ ° °MyChart:  °For all urgent or time sensitive needs we ask that you please call the office to avoid delays. Our number is [phone]. °MyChart is not constantly monitored and due to the large volume of messages a day, replies may take up to 72 business hours. ° °MyChart Policy: °MyChart allows for you to see your visit notes, after visit summary, provider recommendations, lab and tests results, make an appointment, request refills, and contact your provider or the office for non-urgent questions or concerns. Providers are seeing patients during normal business hours and do not have built in time to review MyChart messages.  °We ask that you allow a minimum of 3 business days for responses to MyChart messages. For this reason, please do not send urgent requests through MyChart. Please call the office at [phone]. °New and ongoing conditions may require a visit. We have virtual and in-person visits available for your convenience.  °Complex MyChart concerns may require a visit. Your provider may request you schedule a virtual or in-person visit to ensure we are providing the best care possible. °MyChart messages sent after 11:00 AM on Friday will not be received by the provider until Monday morning.  °  °Lab and Test Results: °You will receive your lab and  test results on MyChart as soon as they are completed and results have been sent by the lab or testing facility. Due to this service, you will receive your results BEFORE your provider.  °I review lab and test results each morning prior to seeing patients. Some results require collaboration with other providers to ensure you are receiving the most appropriate care. For this reason, we ask that you please allow a minimum of 3-5 business days from the time that ALL results have been received for your provider to receive and review lab and test results and contact you about these.  °Most lab and test result comments from the provider will be sent through MyChart. Your provider may recommend changes to the plan of care, follow-up visits, repeat testing, ask questions, or request an office visit to discuss these results. You may reply directly to this message or call the office to provide information for the provider or set up an appointment. °In some instances, you will be called with test results and recommendations. Please let us know if this is preferred and we will make note of this in your chart to provide this for you.    °If you have not heard a response to your lab or test results in 5 business days from all results returning to MyChart, please call the office to let us know. We ask that you please avoid calling prior to this time unless there is an emergent concern. Due to high call volumes, this can delay the resulting process. ° °After Hours: °For all non-emergency after hours   needs, please call the office at [phone] and select the option to reach the on-call  service. On-call services are shared between multiple [facility] offices and therefore it will not be possible to speak directly with your provider. On-call providers may provide medical advice and recommendations, but are unable to provide refills for maintenance medications.  °For all emergency or urgent medical needs after normal business  hours, we recommend that you seek care at the closest Urgent Care or Emergency Department to ensure appropriate treatment in a timely manner.  °[facility] has a 24 hour emergency room located on the ground floor for your convenience.  ° °Urgent Concerns During the Business Day °Providers are seeing patients from 8AM to 5PM with a busy schedule and are most often not able to respond to non-urgent calls until the end of the day or the next business day. °If you should have URGENT concerns during the day, please call and speak to the nurse or schedule a same day appointment so that we can address your concern without delay.  ° °Thank you, again, for choosing me as your health care partner. I appreciate your trust and look forward to learning more about you.  ° °Kavin Weckwerth B. Susana Duell, DNP, FNP-C ° °___________________________________________________________ ° °Health Maintenance Recommendations °Screening Testing °Mammogram °Every 1-2 years based on history and risk factors °Starting at age 50 °Pap Smear °Ages 21-39 every 3 years °Ages 30-65 every 5 years with HPV testing °More frequent testing may be required based on results and history °Colon Cancer Screening °Every 1-10 years based on test performed, risk factors, and history °Starting at age 45 °Bone Density Screening °Every 2-10 years based on history °Starting at age 65 for women °Recommendations for men differ based on medication usage, history, and risk factors °AAA Screening °One time ultrasound °Men 65-75 years old who have ever smoked °Lung Cancer Screening °Low Dose Lung CT every 12 months °Age 50-80 years with a 20 pack-year smoking history who still smoke or who have quit within the last 15 years ° °Screening Labs °Routine  Labs: Complete Blood Count (CBC), Complete Metabolic Panel (CMP), Cholesterol (Lipid Panel) °Every 6-12 months based on history and medications °May be recommended more frequently based on current conditions or  previous results °Hemoglobin A1c Lab °Every 3-12 months based on history and previous results °Starting at age 45 or earlier with diagnosis of diabetes, high cholesterol, BMI >26, and/or risk factors °Frequent monitoring for patients with diabetes to ensure blood sugar control °Thyroid Panel (TSH w/ T3 & T4) °Every 6 months based on history, symptoms, and risk factors °May be repeated more often if on medication °HIV °One time testing for all patients 13 and older °May be repeated more frequently for patients with increased risk factors or exposure °Hepatitis C °One time testing for all patients 18 and older °May be repeated more frequently for patients with increased risk factors or exposure °Gonorrhea, Chlamydia °Every 12 months for all sexually active persons 13-24 years °Additional monitoring may be recommended for those who are considered high risk or who have symptoms °PSA °Men 40-54 years old with risk factors °Additional screening may be recommended from age 55-69 based on risk factors, symptoms, and history ° °Vaccine Recommendations °Tetanus Booster °All adults every 10 years °Flu Vaccine °All patients 6 months and older every year °COVID Vaccine °All patients 12 years and older °Initial dosing with booster °May recommend additional booster based on age and health history °HPV Vaccine °2 doses all   patients age 9-26 °Dosing may be considered for patients over 26 °Shingles Vaccine (Shingrix) °2 doses all adults 50 years and older °Pneumonia (Pneumovax 23) °All adults 65 years and older °May recommend earlier dosing based on health history °Pneumonia (Prevnar 13) °All adults 65 years and older °Dosed 1 year after Pneumovax 23 °Pneumonia (Prevnar 20) °All adults 65 years and older (adults 19-64 with certain conditions or risk factors) °1 dose  °For those who have no received Prevnar 13 vaccine previously ° ° °Additional Screening, Testing, and Vaccinations may be recommended on an individualized basis based on  family history, health history, risk factors, and/or exposure.  °__________________________________________________________ ° °Diet Recommendations for All Patients ° °I recommend that all patients maintain a diet low in saturated fats, carbohydrates, and cholesterol. While this can be challenging at first, it is not impossible and small changes can make big differences.  °Things to try: °Decreasing the amount of soda, sweet tea, and/or juice to one or less per day and replace with water °While water is always the first choice, if you do not like water you may consider °adding a water additive without sugar to improve the taste °other sugar free drinks °Replace potatoes with a brightly colored vegetable at dinner °Use healthy oils, such as canola oil or olive oil, instead of butter or hard margarine °Limit your bread intake to two pieces or less a day °Replace regular pasta with low carb pasta options °Bake, broil, or grill foods instead of frying °Monitor portion sizes  °Eat smaller, more frequent meals throughout the day instead of large meals ° °An important thing to remember is, if you love foods that are not great for your health, you don't have to give them up completely. Instead, allow these foods to be a reward when you have done well. Allowing yourself to still have special treats every once in a while is a nice way to tell yourself thank you for working hard to keep yourself healthy.  ° °Also remember that every day is a new day. If you have a bad day and "fall off the wagon", you can still climb right back up and keep moving along on your journey! ° °We have resources available to help you!  °Some websites that may be helpful include: °www.MyPlate.gov  °Www.VeryWellFit.com °_____________________________________________________________ ° °Activity Recommendations for All Patients ° °I recommend that all adults get at least 20 minutes of moderate physical activity that elevates your heart rate at least 5  days out of the week.  °Some examples include: °Walking or jogging at a pace that allows you to carry on a conversation °Cycling (stationary bike or outdoors) °Water aerobics °Yoga °Weight lifting °Dancing °If physical limitations prevent you from putting stress on your joints, exercise in a pool or seated in a chair are excellent options. ° °Do determine your MAXIMUM heart rate for activity: YOUR AGE - 220 = MAX HeartRate  ° °Remember! °Do not push yourself too hard.  °Start slowly and build up your pace, speed, weight, time in exercise, etc.  °Allow your body to rest between exercise and get good sleep. °You will need more water than normal when you are exerting yourself. Do not wait until you are thirsty to drink. Drink with a purpose of getting in at least 8, 8 ounce glasses of water a day plus more depending on how much you exercise and sweat.  ° ° °If you begin to develop dizziness, chest pain, abdominal pain, jaw pain, shortness of breath, headache,   vision changes, lightheadedness, or other concerning symptoms, stop the activity and allow your body to rest. If your symptoms are severe, seek emergency evaluation immediately. If your symptoms are concerning, but not severe, please let us know so that we can recommend further evaluation.  ° ° ° °

## 2021-04-29 ENCOUNTER — Other Ambulatory Visit: Payer: Self-pay | Admitting: *Deleted

## 2021-04-29 ENCOUNTER — Encounter: Payer: Self-pay | Admitting: *Deleted

## 2021-04-29 DIAGNOSIS — Z3041 Encounter for surveillance of contraceptive pills: Secondary | ICD-10-CM

## 2021-04-29 MED ORDER — LO LOESTRIN FE 1 MG-10 MCG / 10 MCG PO TABS: 1.0000 | ORAL_TABLET | Freq: Every day | ORAL | 0 refills | Status: DC

## 2021-04-29 NOTE — Telephone Encounter (Signed)
Pt states she needs rx,and is willing to pay out of pocket. She stated to still try for pa on next refill. Please advise pt through mychart if this is doable.  ? ?Medication: LO LOESTRIN FE 1 MG-10 MCG / 10 MCG tablet  ? ?Has the patient contacted their pharmacy? No. ? ?[facility]: [facility] ?[address], [place] [address] ?[phone] ?

## 2021-04-29 NOTE — Telephone Encounter (Signed)
One month supply sent to pt's local pharmacy.  PA submitted through Cover My Meds for future refills. ?

## 2021-05-02 NOTE — Telephone Encounter (Signed)
[id number] Approved Start Date:03/30/2021;Coverage End Date:04/29/2022. ?

## 2021-08-03 ENCOUNTER — Ambulatory Visit (INDEPENDENT_AMBULATORY_CARE_PROVIDER_SITE_OTHER): Payer: Managed Care, Other (non HMO) | Admitting: Family Medicine

## 2021-08-03 ENCOUNTER — Encounter: Payer: Self-pay | Admitting: Family Medicine

## 2021-08-03 ENCOUNTER — Telehealth: Payer: Self-pay | Admitting: Family Medicine

## 2021-08-03 ENCOUNTER — Other Ambulatory Visit (HOSPITAL_COMMUNITY)
Admission: RE | Admit: 2021-08-03 | Discharge: 2021-08-03 | Disposition: A | Discharge status: Home / Self Care | Payer: Managed Care, Other (non HMO) | Source: Ambulatory Visit | Attending: Family Medicine | Admitting: Family Medicine

## 2021-08-03 VITALS — BP 128/72 | HR 91 | Ht 68.0 in | Wt 236.4 lb

## 2021-08-03 DIAGNOSIS — Z124 Encounter for screening for malignant neoplasm of cervix: Secondary | ICD-10-CM | POA: Insufficient documentation

## 2021-08-03 DIAGNOSIS — Z0000 Encounter for general adult medical examination without abnormal findings: Secondary | ICD-10-CM | POA: Insufficient documentation

## 2021-08-03 DIAGNOSIS — Z0001 Encounter for general adult medical examination with abnormal findings: Secondary | ICD-10-CM

## 2021-08-03 DIAGNOSIS — F32A Depression, unspecified: Secondary | ICD-10-CM

## 2021-08-03 DIAGNOSIS — F419 Anxiety disorder, unspecified: Secondary | ICD-10-CM | POA: Diagnosis not present

## 2021-08-03 DIAGNOSIS — R112 Nausea with vomiting, unspecified: Secondary | ICD-10-CM

## 2021-08-03 LAB — LIPID PANEL
Cholesterol: 192 mg/dL (ref 0–200)
HDL: 51.8 mg/dL (ref >39.00)
LDL Cholesterol: 110 mg/dL — ABNORMAL HIGH (ref 0–99)
NonHDL: 140.07
Total CHOL/HDL Ratio: 4
Triglycerides: 149 mg/dL (ref 0.0–149.0)
VLDL: 29.8 mg/dL (ref 0.0–40.0)

## 2021-08-03 LAB — CBC
HCT: 41.1 % (ref 36.0–46.0)
Hemoglobin: 13.5 g/dL (ref 12.0–15.0)
MCHC: 32.9 g/dL (ref 30.0–36.0)
MCV: 86.1 fl (ref 78.0–100.0)
Platelets: 334 10*3/uL (ref 150.0–400.0)
RBC: 4.78 Mil/uL (ref 3.87–5.11)
RDW: 12.9 % (ref 11.5–15.5)
WBC: 8.9 10*3/uL (ref 4.0–10.5)

## 2021-08-03 LAB — COMPREHENSIVE METABOLIC PANEL
ALT: 11 U/L (ref 0–35)
AST: 12 U/L (ref 0–37)
Albumin: 4.3 g/dL (ref 3.5–5.2)
Alkaline Phosphatase: 41 U/L (ref 39–117)
BUN: 9 mg/dL (ref 6–23)
CO2: 26 mEq/L (ref 19–32)
Calcium: 9.6 mg/dL (ref 8.4–10.5)
Chloride: 103 mEq/L (ref 96–112)
Creatinine, Ser: 0.72 mg/dL (ref 0.40–1.20)
GFR: 116.66 mL/min (ref >60.00)
Glucose, Bld: 86 mg/dL (ref 70–99)
Potassium: 4.1 mEq/L (ref 3.5–5.1)
Sodium: 140 mEq/L (ref 135–145)
Total Bilirubin: 0.4 mg/dL (ref 0.2–1.2)
Total Protein: 7.1 g/dL (ref 6.0–8.3)

## 2021-08-03 LAB — TSH: TSH: 1.37 u[IU]/mL (ref 0.35–5.50)

## 2021-08-03 MED ORDER — SERTRALINE HCL 25 MG PO TABS: 25.0000 mg | ORAL_TABLET | Freq: Every day | ORAL | 3 refills | Status: DC

## 2021-08-03 MED ORDER — HYDROXYZINE PAMOATE 25 MG PO CAPS: 25.0000 mg | ORAL_CAPSULE | Freq: Three times a day (TID) | ORAL | 0 refills | Status: DC | PRN

## 2021-08-03 NOTE — Progress Notes (Signed)
Complete physical exam  Patient: Diana Reed   DOB: [birth year]   25 y.o. Female  MRN: [id number]  Subjective:    CC: CPE, anxiety related n/v   Diana Reed is a 26 y.o. female who presents today for a complete physical exam. She reports consuming a general diet. Home exercise routine includes minimal, walking occasionally. She generally feels fairly well. She reports sleeping fairly well. She does have additional problems to discuss today.   She reports that over the past few months she has had occasional episodes (3x/week on average) of nausea/vomiting, primarily before bed or first thing in the morning. She thinks it is anxiety related as she has started feeling more anxious overall lately. She denies any other GI symptoms, no abdominal pain, bloating, constipation, diarrhea, blood in stool/vomit, correlation with meal types or times, fevers, body aches, weight loss, etc. She feels like sometimes she has trouble going to sleep because her thoughts are racing. She cannot think of any particular triggers for her anxiety. 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     Most recent fall risk assessment:    08/03/2021    8:36 AM  Fall Risk   Falls in the past year? 0  Number falls in past yr: 0  Injury with Fall? 0  Risk for fall due to : No Fall Risks  Follow up Falls evaluation completed     Most recent depression screenings:    08/03/2021    9:14 AM 08/03/2021    8:36 AM  PHQ 2/9 Scores  PHQ - 2 Score 1 1  PHQ- 9 Score 12        08/03/2021    9:14 AM 08/03/2021    8:36 AM 04/08/2020    2:23 PM  PHQ9 SCORE ONLY  PHQ-9 Total Score 12 1 6       08/03/2021    9:15 AM 08/15/2016   10:14 AM  GAD 7 : Generalized Anxiety Score  Nervous, Anxious, on Edge 3 1  Control/stop worrying 3 2  Worry too much - different things 3 1  Trouble relaxing 2 1  Restless 2 0  Easily annoyed or irritable 2 0  Afraid - awful might happen 1 1   Total GAD 7 Score 16 6  Anxiety Difficulty Somewhat difficult Not difficult at all      Vision:no concerns, Dental: No current dental problems and No regular dental care , and STD: no concerns, not sexually active     Patient Care Team: 08/17/2016, NP as PCP - General (Family Medicine)   Outpatient Medications Prior to Visit  Medication Sig   clobetasol (OLUX) 0.05 % topical foam Use twice daily for one week on scalp during flare. (Patient taking differently: Apply 1 application topically daily as needed (psoriasis.).)   LO LOESTRIN FE 1 MG-10 MCG / 10 MCG tablet Take 1 tablet by mouth daily.   No facility-administered medications prior to visit.    ROS All review of systems negative except what is listed in the HPI        Objective:     BP 128/72   Pulse 91   Ht 5\' 8"  (1.727 m)   Wt 236 lb 6.4 oz (107.2 kg)   BMI 35.94 kg/m    Physical Exam Vitals reviewed.  Constitutional:      General: She is not in acute distress.    Appearance: Normal appearance. She is not ill-appearing.  HENT:  Head: Normocephalic and atraumatic.     Right Ear: Tympanic membrane normal.     Left Ear: Tympanic membrane normal.     Nose: Nose normal.     Mouth/Throat:     Mouth: Mucous membranes are moist.     Pharynx: Oropharynx is clear.  Eyes:     Extraocular Movements: Extraocular movements intact.     Conjunctiva/sclera: Conjunctivae normal.     Pupils: Pupils are equal, round, and reactive to light.  Cardiovascular:     Rate and Rhythm: Normal rate.     Pulses: Normal pulses.     Heart sounds: Normal heart sounds.  Pulmonary:     Effort: Pulmonary effort is normal.     Breath sounds: Normal breath sounds.  Chest:  Breasts:    Right: Normal. No swelling, mass, nipple discharge, skin change or tenderness.     Left: Normal. No swelling, mass, nipple discharge, skin change or tenderness.  Abdominal:     General: Abdomen is flat. Bowel sounds are normal. There is no  distension.     Palpations: Abdomen is soft. There is no mass.     Tenderness: There is no abdominal tenderness. There is no right CVA tenderness, left CVA tenderness, guarding or rebound.  Genitourinary:    General: Normal vulva.     Vagina: Vaginal discharge present.     Cervix: Erythema present. No cervical motion tenderness.     Uterus: Normal.      Adnexa: Right adnexa normal and left adnexa normal.     Rectum: Normal.  Musculoskeletal:        General: Normal range of motion.     Cervical back: Normal range of motion and neck supple. No tenderness.  Lymphadenopathy:     Cervical: No cervical adenopathy.  Skin:    General: Skin is warm and dry.     Capillary Refill: Capillary refill takes less than 2 seconds.  Neurological:     General: No focal deficit present.     Mental Status: She is alert and oriented to person, place, and time. Mental status is at baseline.  Psychiatric:        Mood and Affect: Mood normal.        Behavior: Behavior normal.        Thought Content: Thought content normal.        Judgment: Judgment normal.     No results found for any visits on 08/03/21.     Assessment & Plan:    Routine Health Maintenance and Physical Exam  Immunization History  Administered Date(s) Administered   Janssen (J&J) SARS-COV-2 Vaccination 12/24/2019   Tdap 10/05/2017    Health Maintenance  Topic Date Due   HPV VACCINES (1 - 2-dose series) Never done   COVID-19 Vaccine (2 - Booster for Janssen series) 02/18/2020   INFLUENZA VACCINE  09/27/2021   PAP-Cervical Cytology Screening  04/09/2023   PAP SMEAR-Modifier  04/09/2023   TETANUS/TDAP  10/06/2027   Hepatitis C Screening  Completed   HIV Screening  Completed    Discussed health benefits of physical activity, and encouraged her to engage in regular exercise appropriate for her age and condition.    Screening for cervical cancer Pap last year with ASCUS and significant family history - aunt with cervical  cancer in her 3420s, mom with total hysterectomy r/t abnormal paps, etc. She would prefer to repeat again this year. No symptoms. Declines STD testing. - Cytology - PAP( [facility])  Nausea and vomiting,  unspecified vomiting type Sounds anxiety related, but basic labs updated today as well. Patient aware of signs/symptoms requiring further/urgent evaluation.   Encounter for routine adult health examination with abnormal findings - CBC - Comprehensive metabolic panel - Lipid panel - TSH - Cytology - PAP( [facility])  Anxiety and depression Discussed options with patient. No SI/HI. She would like to try low-dose Zoloft (education provided) and PRN hydroxyzine (education provided). She is agreeable to referral for counseling. Patient aware of signs/symptoms requiring further/urgent evaluation. F/u in 4-6 weeks.   - sertraline (ZOLOFT) 25 MG tablet; Take 1 tablet (25 mg total) by mouth daily.  Dispense: 30 tablet; Refill: 3 - hydrOXYzine (VISTARIL) 25 MG capsule; Take 1 capsule (25 mg total) by mouth every 8 (eight) hours as needed.  Dispense: 30 capsule; Refill: 0 - Ambulatory referral to Behavioral Health   Return in about 1 year (around 08/04/2022) for CPE; 6 week mood f/u .     Clayborne Dana, NP

## 2021-08-03 NOTE — Telephone Encounter (Signed)
Brand new starts go to local pharmacies but future refills will go to [facility].

## 2021-08-03 NOTE — Patient Instructions (Signed)
Taking the medicine as directed and not missing any doses is one of the best things you can do to treat your anxiety/depression.  Here are some things to keep in mind: Side effects (stomach upset, some increased anxiety) may happen before you notice a benefit.  These side effects typically go away over time. Changes to your dose of medicine or a change in medication all together is sometimes necessary Many people will notice an improvement within two weeks but the full effect of the medication can take up to 4-6 weeks Stopping the medication when you start feeling better often results in a return of symptoms. Most people need to be on medication at least 6-12 months If you start having thoughts of hurting yourself or others after starting this medicine, please call me immediately.    

## 2021-08-03 NOTE — Telephone Encounter (Signed)
Pt stated she wants to make sure for next time all of [facility].

## 2021-08-05 LAB — CYTOLOGY - PAP: Diagnosis: NEGATIVE

## 2021-08-25 ENCOUNTER — Other Ambulatory Visit: Payer: Self-pay | Admitting: Family Medicine

## 2021-08-25 DIAGNOSIS — F419 Anxiety disorder, unspecified: Secondary | ICD-10-CM

## 2021-08-26 ENCOUNTER — Emergency Department: Payer: Managed Care, Other (non HMO)

## 2021-08-26 ENCOUNTER — Other Ambulatory Visit: Payer: Self-pay

## 2021-08-26 ENCOUNTER — Emergency Department
Admission: EM | Admit: 2021-08-26 | Discharge: 2021-08-26 | Disposition: A | Discharge status: Home / Self Care | Payer: Managed Care, Other (non HMO) | Attending: Emergency Medicine | Admitting: Emergency Medicine

## 2021-08-26 ENCOUNTER — Encounter: Payer: Self-pay | Admitting: Intensive Care

## 2021-08-26 DIAGNOSIS — R630 Anorexia: Secondary | ICD-10-CM | POA: Diagnosis not present

## 2021-08-26 DIAGNOSIS — R1031 Right lower quadrant pain: Secondary | ICD-10-CM | POA: Insufficient documentation

## 2021-08-26 DIAGNOSIS — D72829 Elevated white blood cell count, unspecified: Secondary | ICD-10-CM | POA: Diagnosis not present

## 2021-08-26 LAB — URINALYSIS, ROUTINE W REFLEX MICROSCOPIC
Bilirubin Urine: NEGATIVE
Glucose, UA: NEGATIVE mg/dL
Hgb urine dipstick: NEGATIVE
Ketones, ur: 5 mg/dL — AB
Leukocytes,Ua: NEGATIVE
Nitrite: NEGATIVE
Protein, ur: NEGATIVE mg/dL
Specific Gravity, Urine: 1.019 (ref 1.005–1.030)
pH: 5 (ref 5.0–8.0)

## 2021-08-26 LAB — COMPREHENSIVE METABOLIC PANEL
ALT: 16 U/L (ref 0–44)
AST: 18 U/L (ref 15–41)
Albumin: 4.2 g/dL (ref 3.5–5.0)
Alkaline Phosphatase: 40 U/L (ref 38–126)
Anion gap: 10 (ref 5–15)
BUN: 11 mg/dL (ref 6–20)
CO2: 23 mmol/L (ref 22–32)
Calcium: 9.6 mg/dL (ref 8.9–10.3)
Chloride: 106 mmol/L (ref 98–111)
Creatinine, Ser: 0.68 mg/dL (ref 0.44–1.00)
GFR, Estimated: >60 mL/min (ref >60)
Glucose, Bld: 100 mg/dL — ABNORMAL HIGH (ref 70–99)
Potassium: 3.5 mmol/L (ref 3.5–5.1)
Sodium: 139 mmol/L (ref 135–145)
Total Bilirubin: 0.6 mg/dL (ref 0.3–1.2)
Total Protein: 7.9 g/dL (ref 6.5–8.1)

## 2021-08-26 LAB — CBC
HCT: 42.4 % (ref 36.0–46.0)
Hemoglobin: 13.6 g/dL (ref 12.0–15.0)
MCH: 27.5 pg (ref 26.0–34.0)
MCHC: 32.1 g/dL (ref 30.0–36.0)
MCV: 85.8 fL (ref 80.0–100.0)
Platelets: 339 10*3/uL (ref 150–400)
RBC: 4.94 MIL/uL (ref 3.87–5.11)
RDW: 12.1 % (ref 11.5–15.5)
WBC: 13.7 10*3/uL — ABNORMAL HIGH (ref 4.0–10.5)
nRBC: 0 % (ref 0.0–0.2)

## 2021-08-26 LAB — POC URINE PREG, ED: Preg Test, Ur: NEGATIVE

## 2021-08-26 LAB — TROPONIN I (HIGH SENSITIVITY): Troponin I (High Sensitivity): <2 ng/L (ref <18)

## 2021-08-26 LAB — LIPASE, BLOOD: Lipase: 34 U/L (ref 11–51)

## 2021-08-26 MED ORDER — SODIUM CHLORIDE 0.9 % IV BOLUS
1000.0000 mL | Freq: Once | INTRAVENOUS | Status: AC
  Administered 2021-08-26: 1000 mL via INTRAVENOUS

## 2021-08-26 MED ORDER — ONDANSETRON HCL 4 MG/2ML IJ SOLN
4.0000 mg | Freq: Once | INTRAMUSCULAR | Status: AC
  Administered 2021-08-26: 4 mg via INTRAVENOUS
  Filled 2021-08-26: qty 2

## 2021-08-26 MED ORDER — SODIUM CHLORIDE 0.9 % IV BOLUS: 1000.0000 mL | Freq: Once | INTRAVENOUS | Status: DC

## 2021-08-26 MED ORDER — ONDANSETRON 4 MG PO TBDP: 4.0000 mg | ORAL_TABLET | Freq: Three times a day (TID) | ORAL | 0 refills | Status: DC | PRN

## 2021-08-26 MED ORDER — IOHEXOL 300 MG/ML  SOLN
100.0000 mL | Freq: Once | INTRAMUSCULAR | Status: AC | PRN
  Administered 2021-08-26: 100 mL via INTRAVENOUS

## 2021-08-26 MED ORDER — MORPHINE SULFATE (PF) 4 MG/ML IV SOLN
4.0000 mg | INTRAVENOUS | Status: DC | PRN
  Administered 2021-08-26: 4 mg via INTRAVENOUS
  Filled 2021-08-26: qty 1

## 2021-08-26 NOTE — ED Notes (Signed)
Pt to CT

## 2021-08-26 NOTE — ED Notes (Signed)
Pt called for ultrasound but did not answer. [place] tech looked for pt in the waiting areas and outside and pt did not answer

## 2021-08-26 NOTE — ED Provider Notes (Signed)
[facility] Emergency Department Provider Note     Event Date/Time   First MD Initiated Contact with Patient 08/26/21 1525     (approximate)   History   Abdominal Pain   HPI  Diana Reed is a 26 y.o. female with a noncontributory medical history, presents to the ED with reports of abdominal pain with onset last at around 9 PM.  Patient localizes the pain to the generalized to right lower quadrant and notes decreased appetite.  Patient denies any associated nausea, vomiting, or diarrhea patient also denies any urinary frequency, urgency, or hematuria.  Patient without fevers or bowel changes reported. She notes her last PO intake of solid food was last night.   Physical Exam   Triage Vital Signs: ED Triage Vitals  Enc Vitals Group     BP 08/26/21 1254 134/71     Pulse Rate 08/26/21 1254 100     Resp 08/26/21 1254 18     Temp 08/26/21 1254 98.1 F (36.7 C)     Temp Source 08/26/21 1254 Oral     SpO2 08/26/21 1254 99 %     Weight 08/26/21 1251 236 lb (107 kg)     Height 08/26/21 1251 5\' 8"  (1.727 m)     Head Circumference --      Peak Flow --      Pain Score 08/26/21 1250 4     Pain Loc --      Pain Edu? --      Excl. in GC? --     Most recent vital signs: Vitals:   08/26/21 1609 08/26/21 1816  BP: 135/77 125/76  Pulse: 85 85  Resp: 16 16  Temp:  98.2 F (36.8 C)  SpO2: 98% 98%    General Awake, no distress.  CV:  Good peripheral perfusion.  RESP:  Normal effort.  ABD:  No distention. Soft, mildly tender to palp over the lower quadrants. No CVA tenderness elicited. No rigidity, organomegaly, or rebound.  GU: deferred  ED Results / Procedures / Treatments   Labs (all labs ordered are listed, but only abnormal results are displayed) Labs Reviewed  COMPREHENSIVE METABOLIC PANEL - Abnormal; Notable for the following components:      Result Value   Glucose, Bld 100 (*)    All other components within normal limits  CBC -  Abnormal; Notable for the following components:   WBC 13.7 (*)    All other components within normal limits  URINALYSIS, ROUTINE W REFLEX MICROSCOPIC - Abnormal; Notable for the following components:   Color, Urine YELLOW (*)    APPearance CLEAR (*)    Ketones, ur 5 (*)    All other components within normal limits  LIPASE, BLOOD  POC URINE PREG, ED  TROPONIN I (HIGH SENSITIVITY)     EKG    RADIOLOGY  I personally viewed and evaluated these images as part of my medical decision making, as well as reviewing the written report by the radiologist.  ED Provider Interpretation: no acute findings}  RUQ ABD 08/28/21  IMPRESSION: Hepatic steatosis.   No evidence of cholelithiasis or biliary dilatation.   CT ABD/Pelvis w/ CM  IMPRESSION: 1. No acute findings are noted in the abdomen or pelvis to account for the patient's symptoms.  PROCEDURES:  Critical Care performed: No  Procedures   MEDICATIONS ORDERED IN ED: Medications  ondansetron (ZOFRAN) injection 4 mg (4 mg Intravenous Given 08/26/21 1612)  sodium chloride 0.9 % bolus 1,000 mL (  0 mLs Intravenous Stopped 08/26/21 1818)  iohexol (OMNIPAQUE) 300 MG/ML solution 100 mL (100 mLs Intravenous Contrast Given 08/26/21 1619)     IMPRESSION / MDM / ASSESSMENT AND PLAN / ED COURSE  I reviewed the triage vital signs and the nursing notes.                              Differential diagnosis includes, but is not limited to, imited to, ovarian cyst, ovarian torsion, acute appendicitis, diverticulitis, urinary tract infection/pyelonephritis, endometriosis, bowel obstruction, colitis, renal colic, gastroenteritis, hernia, fibroids, endometriosis, pregnancy related pain including ectopic pregnancy, etc.   Patient's presentation is most consistent with acute presentation with potential threat to life or bodily function.  Patient's diagnosis is consistent with right lower quadrant abdominal pain without a clear etiology.  His overall  work-up is reassuring she did have an elevated white blood count at 13.7, but is afebrile, without tachycardia, toxic appearance, or signs of acute sepsis.  Ultrasound and CT imaging are both negative at this time without a clear source for the patient's discomfort.  While the CT is equivocal patient is given strict return precautions for the next 24 to 48 hours to return if symptoms should increase including development of fevers, nausea, vomiting, or diarrhea.  Patient and her mother are agreeable to the plan at this time and will be discharged with furl to gastroenterology.  Patient will be discharged home with prescriptions for Zofran. Patient is given ED precautions to return to the ED for any worsening or new symptoms.     FINAL CLINICAL IMPRESSION(S) / ED DIAGNOSES   Final diagnoses:  Right lower quadrant abdominal pain     Rx / DC Orders   ED Discharge Orders          Ordered    ondansetron (ZOFRAN-ODT) 4 MG disintegrating tablet  Every 8 hours PRN        08/26/21 1803             Note:  This document was prepared using Dragon voice recognition software and may include unintentional dictation errors.    Lissa Hoard, PA-C 08/28/21 1532    Gilles Chiquito, MD 08/28/21 [phone]

## 2021-08-26 NOTE — ED Provider Triage Note (Signed)
Emergency Medicine Provider Triage Evaluation Note  Geralyn Figiel , a 26 y.o. female  was evaluated in triage.  Pt complains of RUQ pain since last night around 9pm. Decreased appetite, but no fever, nausea, vomiting, or diarrhea.  Physical Exam  BP 134/71 (BP Location: Right Arm)   Pulse 100   Temp 98.1 F (36.7 C) (Oral)   Resp 18   Ht 5\' 8"  (1.727 m)   Wt 107 kg   SpO2 99%   BMI 35.88 kg/m  Gen:   Awake, no distress   Resp:  Normal effort  MSK:   Moves extremities without difficulty  Other:    Medical Decision Making  Medically screening exam initiated at 1:02 PM.  Appropriate orders placed.  Kimblery Diop was informed that the remainder of the evaluation will be completed by another provider, this initial triage assessment does not replace that evaluation, and the importance of remaining in the ED until their evaluation is complete.    Gwenlyn Perking, FNP 08/26/21 1407

## 2021-08-26 NOTE — Discharge Instructions (Signed)
Your exam, labs, ultrasound, and CT all normal and reassuring at this time.  There is no indication for your abdominal pain as your appendix, colon, and stomach are all normal and reassuring.  You should take OTC Tylenol and Motrin as well as the nausea medicine as needed.  Follow-up with your primary provider or GI medicine for ongoing evaluation.  Return to the ED immediately for worsening pain, nausea, vomiting.

## 2021-08-26 NOTE — ED Triage Notes (Signed)
Patient c/o abdominal pain since last night around 9pm. Denies N/V/D. Denies urinary symptoms

## 2021-08-26 NOTE — ED Notes (Signed)
Pt verbalized understanding of discharge instructions, prescriptions, and follow-up care instructions. Pt advised if symptoms worsen to return to ED.  

## 2021-08-27 ENCOUNTER — Emergency Department
Admission: EM | Admit: 2021-08-27 | Discharge: 2021-08-27 | Disposition: A | Discharge status: Home / Self Care | Payer: Managed Care, Other (non HMO) | Attending: Emergency Medicine | Admitting: Emergency Medicine

## 2021-08-27 ENCOUNTER — Emergency Department: Payer: Managed Care, Other (non HMO)

## 2021-08-27 DIAGNOSIS — R1031 Right lower quadrant pain: Secondary | ICD-10-CM | POA: Insufficient documentation

## 2021-08-27 DIAGNOSIS — R630 Anorexia: Secondary | ICD-10-CM | POA: Diagnosis not present

## 2021-08-27 DIAGNOSIS — R110 Nausea: Secondary | ICD-10-CM | POA: Insufficient documentation

## 2021-08-27 LAB — WET PREP, GENITAL
Clue Cells Wet Prep HPF POC: NONE SEEN
Sperm: NONE SEEN
Trich, Wet Prep: NONE SEEN
WBC, Wet Prep HPF POC: <10 (ref <10)
Yeast Wet Prep HPF POC: NONE SEEN

## 2021-08-27 LAB — COMPREHENSIVE METABOLIC PANEL
ALT: 13 U/L (ref 0–44)
AST: 16 U/L (ref 15–41)
Albumin: 3.9 g/dL (ref 3.5–5.0)
Alkaline Phosphatase: 38 U/L (ref 38–126)
Anion gap: 7 (ref 5–15)
BUN: 10 mg/dL (ref 6–20)
CO2: 24 mmol/L (ref 22–32)
Calcium: 9 mg/dL (ref 8.9–10.3)
Chloride: 107 mmol/L (ref 98–111)
Creatinine, Ser: 0.79 mg/dL (ref 0.44–1.00)
GFR, Estimated: >60 mL/min (ref >60)
Glucose, Bld: 103 mg/dL — ABNORMAL HIGH (ref 70–99)
Potassium: 3.8 mmol/L (ref 3.5–5.1)
Sodium: 138 mmol/L (ref 135–145)
Total Bilirubin: 0.7 mg/dL (ref 0.3–1.2)
Total Protein: 7.4 g/dL (ref 6.5–8.1)

## 2021-08-27 LAB — URINALYSIS, ROUTINE W REFLEX MICROSCOPIC
Bacteria, UA: NONE SEEN
Bilirubin Urine: NEGATIVE
Glucose, UA: NEGATIVE mg/dL
Hgb urine dipstick: NEGATIVE
Ketones, ur: 5 mg/dL — AB
Nitrite: NEGATIVE
Protein, ur: NEGATIVE mg/dL
Specific Gravity, Urine: 1.023 (ref 1.005–1.030)
pH: 6 (ref 5.0–8.0)

## 2021-08-27 LAB — CBC WITH DIFFERENTIAL/PLATELET
Abs Immature Granulocytes: 0.04 10*3/uL (ref 0.00–0.07)
Basophils Absolute: 0.1 10*3/uL (ref 0.0–0.1)
Basophils Relative: 1 %
Eosinophils Absolute: 0.1 10*3/uL (ref 0.0–0.5)
Eosinophils Relative: 1 %
HCT: 40.6 % (ref 36.0–46.0)
Hemoglobin: 13.1 g/dL (ref 12.0–15.0)
Immature Granulocytes: 0 %
Lymphocytes Relative: 23 %
Lymphs Abs: 2.5 10*3/uL (ref 0.7–4.0)
MCH: 27.9 pg (ref 26.0–34.0)
MCHC: 32.3 g/dL (ref 30.0–36.0)
MCV: 86.4 fL (ref 80.0–100.0)
Monocytes Absolute: 0.8 10*3/uL (ref 0.1–1.0)
Monocytes Relative: 7 %
Neutro Abs: 7.4 10*3/uL (ref 1.7–7.7)
Neutrophils Relative %: 68 %
Platelets: 323 10*3/uL (ref 150–400)
RBC: 4.7 MIL/uL (ref 3.87–5.11)
RDW: 12.1 % (ref 11.5–15.5)
WBC: 10.9 10*3/uL — ABNORMAL HIGH (ref 4.0–10.5)
nRBC: 0 % (ref 0.0–0.2)

## 2021-08-27 LAB — CHLAMYDIA/NGC RT PCR (ARMC ONLY)
Chlamydia Tr: NOT DETECTED
N gonorrhoeae: NOT DETECTED

## 2021-08-27 LAB — PREGNANCY, URINE: Preg Test, Ur: NEGATIVE

## 2021-08-27 MED ORDER — IOHEXOL 300 MG/ML  SOLN
80.0000 mL | Freq: Once | INTRAMUSCULAR | Status: AC | PRN
  Administered 2021-08-27: 80 mL via INTRAVENOUS

## 2021-08-27 MED ORDER — ONDANSETRON HCL 4 MG/2ML IJ SOLN
4.0000 mg | Freq: Once | INTRAMUSCULAR | Status: AC
  Administered 2021-08-27: 4 mg via INTRAVENOUS
  Filled 2021-08-27: qty 2

## 2021-08-27 NOTE — Discharge Instructions (Signed)
Please try Motrin up to 4 of the over-the-counter pills 3 times a day with food for a few days.  If that helps you can cut back a little bit 3 over-the-counter pills 3 times a day and stay on that for a week or 2.  Please return for increasing pain or fever or vomiting or feeling sicker.  Please follow-up with Dr. Feliberto Gottron who is on-call for OB/GYN.  I have included his contact information above.  Please let him know that your wet prep showed no white blood cells your CT and ultrasound were normal.  And please let him know about your strong family history of endometriosis.  It is possible they might want to evaluate you laparoscopically.

## 2021-08-27 NOTE — ED Notes (Signed)
Dc ppw provided. Pt verbalizes understand, Pt follwup and rx information reviewed as needed. Pt provides verbal consent for dc. Pt to lobby on foot.

## 2021-08-27 NOTE — ED Triage Notes (Signed)
Pt was discharged around 6pm yesterday for the same right sided flank pain without urinary symptoms.

## 2021-08-27 NOTE — ED Provider Notes (Signed)
[facility] Provider Note    Event Date/Time   First MD Initiated Contact with Patient 08/27/21 0730     (approximate)   History   Abdominal Pain and Flank Pain   HPI  Diana Reed is a 26 y.o. female who reports right lower quadrant pain starting Thursday.  Is gradually gotten worse.  She has some decreased appetite and today had some nausea.  She has no vomiting she has no diarrhea no urgency or frequency.  No vaginal discharge no hematuria no fever.  She was seen yesterday had ultrasound of the right upper quadrant which was negative and a CT scan of the belly which was negative.  Patient reports pain is worse with deep breathing and when she had the bumps in the road coming here.      Physical Exam   Triage Vital Signs: ED Triage Vitals [08/27/21 0609]  Enc Vitals Group     BP (!) 131/93     Pulse Rate 89     Resp 18     Temp 97.6 F (36.4 C)     Temp Source Oral     SpO2 100 %     Weight      Height      Head Circumference      Peak Flow      Pain Score 7     Pain Loc      Pain Edu?      Excl. in GC?     Most recent vital signs: Vitals:   08/27/21 0810 08/27/21 1044  BP: 125/69 114/76  Pulse: 76 (!) 109  Resp: 18 17  Temp:    SpO2: 98% 96%     General: Awake, no distress.  CV:  Good peripheral perfusion.  Heart regular rate and rhythm no audible murmurs Resp:  Normal effort.  Lungs are clear Abd:  No distention.  Belly soft there is tenderness to palpation and percussion in the right lower quadrant around McBurney's point.  There is very little suprapubic tenderness. Extremities: No edema GYN: Normal perineum.  Vagina with  thick whitish discharge.  Generally diffusely tender more so on the right side though.  Not even tender posteriorly.  ED Results / Procedures / Treatments   Labs (all labs ordered are listed, but only abnormal results are displayed) Labs Reviewed  COMPREHENSIVE METABOLIC PANEL - Abnormal;  Notable for the following components:      Result Value   Glucose, Bld 103 (*)    All other components within normal limits  CBC WITH DIFFERENTIAL/PLATELET - Abnormal; Notable for the following components:   WBC 10.9 (*)    All other components within normal limits  URINALYSIS, ROUTINE W REFLEX MICROSCOPIC - Abnormal; Notable for the following components:   Color, Urine YELLOW (*)    APPearance CLEAR (*)    Ketones, ur 5 (*)    Leukocytes,Ua SMALL (*)    All other components within normal limits  WET PREP, GENITAL  CHLAMYDIA/NGC RT PCR ([facility] ONLY)            PREGNANCY, URINE     EKG     RADIOLOGY  CT read by radiology reviewed and interpreted by me shows no acute disease Ultrasound read by radiology shows also no acute disease right ovary is normal.  Left ovary was not seen.  PROCEDURES:  Critical Care performed:   Procedures   MEDICATIONS ORDERED IN ED: Medications  iohexol (OMNIPAQUE) 300 MG/ML solution 80 mL (  80 mLs Intravenous Contrast Given 08/27/21 0832)  ondansetron (ZOFRAN) injection 4 mg (4 mg Intravenous Given 08/27/21 0941)     IMPRESSION / MDM / ASSESSMENT AND PLAN / ED COURSE  I reviewed the triage vital signs and the nursing notes. Patient's white blood count is down today to almost normal.  This is an improvement from yesterday's 13,000 white count.  CT again shows nothing.  I discussed the patient briefly with Dr. Tonna Boehringer who looked at yesterday's CT and agreed it was negative.  Patient has a large family history of endometriosis.  This is possibly what is going on here.  I could not feel anything on the pelvic and she was diffusely tender but has not had any intercourse at all.  Additionally she has no white cells on her wet prep. This makes any STD very unlikely and PID very unlikely.  I will not treat her at this point.  I will refer her to Dr. Feliberto Gottron who is on for unassigned OB/GYN. I will ask her to return if she gets worse or has any fever vomiting  etc.  Patient's presentation is most consistent with acute presentation with potential threat to life or bodily function.  Patient's mother had had to have a hysterectomy for her endometriosis other family members have had endometriosis as well.  I explained to them while I do not think this is PID PID could cause a problem with fertility but endometriosis could also  The patient is on the cardiac monitor to evaluate for evidence of arrhythmia and/or significant heart rate changes.  None have been seen      FINAL CLINICAL IMPRESSION(S) / ED DIAGNOSES   Final diagnoses:  Right lower quadrant abdominal pain     Rx / DC Orders   ED Discharge Orders     None        Note:  This document was prepared using Dragon voice recognition software and may include unintentional dictation errors.   Arnaldo Natal, MD 08/27/21 1112

## 2021-09-06 ENCOUNTER — Telehealth: Payer: Self-pay | Admitting: Family Medicine

## 2021-09-06 DIAGNOSIS — R102 Pelvic and perineal pain: Secondary | ICD-10-CM

## 2021-09-06 NOTE — Telephone Encounter (Signed)
Pt notified of referral  

## 2021-09-06 NOTE — Telephone Encounter (Signed)
Patient states she was seen in the ED for intense abdominal pain and they advised to see an OBGYN for possible endometriosis . They told her they would place a referral for her but they have yet to do it. She would like to know if [facility] can place an order. Please advise.

## 2021-09-14 ENCOUNTER — Ambulatory Visit (INDEPENDENT_AMBULATORY_CARE_PROVIDER_SITE_OTHER): Payer: Managed Care, Other (non HMO) | Admitting: Family Medicine

## 2021-09-14 ENCOUNTER — Encounter: Payer: Self-pay | Admitting: Family Medicine

## 2021-09-14 VITALS — BP 114/57 | HR 68 | Ht 68.0 in | Wt 238.6 lb

## 2021-09-14 DIAGNOSIS — F419 Anxiety disorder, unspecified: Secondary | ICD-10-CM

## 2021-09-14 DIAGNOSIS — R21 Rash and other nonspecific skin eruption: Secondary | ICD-10-CM | POA: Diagnosis not present

## 2021-09-14 DIAGNOSIS — F32A Depression, unspecified: Secondary | ICD-10-CM | POA: Diagnosis not present

## 2021-09-14 DIAGNOSIS — Z3041 Encounter for surveillance of contraceptive pills: Secondary | ICD-10-CM

## 2021-09-14 MED ORDER — CLOBETASOL PROPIONATE 0.05 % EX FOAM
CUTANEOUS | 1 refills | Status: DC
Start: 2021-09-14 — End: 2021-09-16

## 2021-09-14 MED ORDER — LO LOESTRIN FE 1 MG-10 MCG / 10 MCG PO TABS: 1.0000 | ORAL_TABLET | Freq: Every day | ORAL | 6 refills | Status: DC

## 2021-09-14 MED ORDER — SERTRALINE HCL 50 MG PO TABS: 50.0000 mg | ORAL_TABLET | Freq: Every day | ORAL | 3 refills | Status: DC

## 2021-09-14 NOTE — Assessment & Plan Note (Signed)
No SI/HI. She would like to increase Zoloft dose to 50 mg daily. Educated on potential side effects to watch for with dose changes. Patient aware of signs/symptoms requiring further/urgent evaluation.  F/u in 6 weeks to reassess.

## 2021-09-14 NOTE — Progress Notes (Signed)
Established Patient Office Visit  Subjective   Patient ID: Diana Reed, female    DOB: 19-Apr-1995  Age: 26 y.o. MRN: [id number]  CC: mood f/u   HPI  Patient is here for follow-up on anxiety/depression. At last visit on 08/03/21 she was started on Zoloft 25 mg daily and PRN hydroxyzine.   Today she reports she is doing well. For the first few weeks, she was needing occasional hydroxyzine, but has not needed any recently. She has not had any side effects from the Zoloft. She has noticed some improvement, but feels like there is room to increase dose for better control. She denies any SI/HI.       09/14/2021    8:30 AM 08/03/2021    9:14 AM 08/03/2021    8:36 AM  PHQ9 SCORE ONLY  PHQ-9 Total Score 11 12 1       09/14/2021    8:30 AM 08/03/2021    9:15 AM 08/15/2016   10:14 AM  GAD 7 : Generalized Anxiety Score  Nervous, Anxious, on Edge 1 3 1   Control/stop worrying 1 3 2   Worry too much - different things 1 3 1   Trouble relaxing 1 2 1   Restless 0 2 0  Easily annoyed or irritable 1 2 0  Afraid - awful might happen 0 1 1  Total GAD 7 Score 5 16 6   Anxiety Difficulty Somewhat difficult Somewhat difficult Not difficult at all        ROS All review of systems negative except what is listed in the HPI     Objective:     BP (!) 114/57   Pulse 68   Ht 5\' 8"  (1.727 m)   Wt 238 lb 9.6 oz (108.2 kg)   BMI 36.28 kg/m    Physical Exam Vitals reviewed.  Constitutional:      Appearance: Normal appearance.  HENT:     Head: Normocephalic and atraumatic.  Cardiovascular:     Rate and Rhythm: Normal rate and regular rhythm.  Pulmonary:     Effort: Pulmonary effort is normal.     Breath sounds: Normal breath sounds.  Neurological:     General: No focal deficit present.     Mental Status: She is alert and oriented to person, place, and time. Mental status is at baseline.  Psychiatric:        Mood and Affect: Mood normal.        Behavior: Behavior normal.         Thought Content: Thought content normal.        Judgment: Judgment normal.      No results found for any visits on 09/14/21.    The ASCVD Risk score (Arnett DK, et al., 2019) failed to calculate for the following reasons:   The 2019 ASCVD risk score is only valid for ages 25 to 105    Assessment & Plan:   Problem List Items Addressed This Visit       Other   Anxiety and depression - Primary    No SI/HI. She would like to increase Zoloft dose to 50 mg daily. Educated on potential side effects to watch for with dose changes. Patient aware of signs/symptoms requiring further/urgent evaluation.  F/u in 6 weeks to reassess.       Relevant Medications   sertraline (ZOLOFT) 50 MG tablet   Other Visit Diagnoses     Encounter for surveillance of contraceptive pills     No missed doses. Refills sent in.  Relevant Medications   LO LOESTRIN FE 1 MG-10 MCG / 10 MCG tablet   Rash of face     Refill provided.    Relevant Medications   clobetasol (OLUX) 0.05 % topical foam       Return in about 6 weeks (around 10/26/2021) for mood follow-up.    Clayborne Dana, NP

## 2021-09-16 ENCOUNTER — Other Ambulatory Visit: Payer: Self-pay | Admitting: *Deleted

## 2021-09-16 MED ORDER — CLOBETASOL PROPIONATE 0.05 % EX SOLN: 1.0000 | Freq: Two times a day (BID) | CUTANEOUS | 0 refills | Status: DC

## 2021-11-02 ENCOUNTER — Ambulatory Visit (INDEPENDENT_AMBULATORY_CARE_PROVIDER_SITE_OTHER): Payer: Managed Care, Other (non HMO) | Admitting: Family

## 2021-11-02 DIAGNOSIS — F32A Depression, unspecified: Secondary | ICD-10-CM

## 2021-11-02 DIAGNOSIS — F419 Anxiety disorder, unspecified: Secondary | ICD-10-CM | POA: Diagnosis not present

## 2021-11-02 NOTE — Progress Notes (Signed)
Subjective:   By signing my name below, I, Cassell Clement, attest that this documentation has been prepared under the direction and in the presence of Birdie Sons, NP 11/02/2021     Patient ID: Diana Reed, female    DOB: 08-19-1995, 26 y.o.   MRN: [id number]  Chief Complaint  Patient presents with   Depression    Follow up doing much better on new dose of Zoloft    HPI Patient is in today for an office visit  Mood: She was previously taking 25 Mg of Zoloft but symptoms weren't making drastic improvement so her medication was increased to 50 Mg. She reports that since increasing her medication, her mood has improved. Her weight is also stable.  Sleep: She has not been using her 25 Mg of Hydroxyzine because it causes her to struggle to get out of bed Immunizations: She is not interested in receiving an influenza vaccine during today's visit  Health Maintenance Due  Topic Date Due   HPV VACCINES (1 - 2-dose series) Never done   COVID-19 Vaccine (2 - Booster for [employer] series) 02/18/2020   INFLUENZA VACCINE  Never done    Past Medical History:  Diagnosis Date   Allergy    Anxiety    Depression    Psoriasis     Past Surgical History:  Procedure Laterality Date   ORIF HUMERUS FRACTURE Left 01/08/2020   Procedure: OPEN REDUCTION INTERNAL FIXATION (ORIF) HUMERAL SHAFT FRACTURE;  Surgeon: Myrene Galas, MD;  Location: [facility] OR;  Service: Orthopedics;  Laterality: Left;   WISDOM TOOTH EXTRACTION      Family History  Problem Relation Age of Onset   Depression Mother    Arthritis Mother    Hypertension Father    Healthy Father    Healthy Brother    Healthy Brother    Cervical cancer Maternal Aunt    Kidney disease Maternal Grandmother    Hypertension Maternal Grandmother    Cancer Maternal Grandmother    Arthritis Maternal Grandmother    Diabetes Maternal Grandmother    Colon cancer Maternal Grandmother    Heart disease Maternal Grandfather    Arthritis  Maternal Grandfather    Heart attack Maternal Grandfather    Miscarriages / Stillbirths Paternal Grandmother    Cancer Paternal Grandmother    Arthritis Paternal Grandmother    Breast cancer Paternal Grandmother     Social History   Socioeconomic History   Marital status: Single    Spouse name: Not on file   Number of children: Not on file   Years of education: Not on file   Highest education level: Not on file  Occupational History   Occupation: Full time student   Occupation: Part time employee  Tobacco Use   Smoking status: Never   Smokeless tobacco: Never  Vaping Use   Vaping Use: Never used  Substance and Sexual Activity   Alcohol use: Yes    Alcohol/week: 2.0 standard drinks of alcohol    Types: 1 Glasses of wine, 1 Shots of liquor per week   Drug use: Never   Sexual activity: Never    Birth control/protection: Pill  Other Topics Concern   Not on file  Social History Narrative   Not on file   Social Determinants of Health   Financial Resource Strain: Not on file  Food Insecurity: Not on file  Transportation Needs: Not on file  Physical Activity: Not on file  Stress: Not on file  Social Connections: Not on  file  Intimate Partner Violence: Not on file    Outpatient Medications Prior to Visit  Medication Sig Dispense Refill   clobetasol (TEMOVATE) 0.05 % external solution Apply 1 Application topically 2 (two) times daily. 50 mL 0   LO LOESTRIN FE 1 MG-10 MCG / 10 MCG tablet Take 1 tablet by mouth daily. 28 tablet 6   ondansetron (ZOFRAN-ODT) 4 MG disintegrating tablet Take 1 tablet (4 mg total) by mouth every 8 (eight) hours as needed for nausea or vomiting. 15 tablet 0   sertraline (ZOLOFT) 50 MG tablet Take 1 tablet (50 mg total) by mouth daily. 30 tablet 3   hydrOXYzine (VISTARIL) 25 MG capsule Take 1 capsule (25 mg total) by mouth every 8 (eight) hours as needed. 30 capsule 0   No facility-administered medications prior to visit.    Allergies   Allergen Reactions   Other     Eggplant-swollen,itchy tongue & some swelling/itching of throat    ROS See HPI    Objective:    Physical Exam Constitutional:      General: She is not in acute distress.    Appearance: Normal appearance. She is not ill-appearing.  HENT:     Head: Normocephalic and atraumatic.     Right Ear: External ear normal.     Left Ear: External ear normal.  Eyes:     Extraocular Movements: Extraocular movements intact.     Pupils: Pupils are equal, round, and reactive to light.  Cardiovascular:     Rate and Rhythm: Normal rate and regular rhythm.     Heart sounds: Normal heart sounds. No murmur heard.    No gallop.  Pulmonary:     Effort: Pulmonary effort is normal. No respiratory distress.     Breath sounds: Normal breath sounds. No wheezing or rales.  Skin:    General: Skin is warm and dry.  Neurological:     Mental Status: She is alert and oriented to person, place, and time.  Psychiatric:        Mood and Affect: Mood normal.        Behavior: Behavior normal.        Judgment: Judgment normal.     Temp 98.3 F (36.8 C) (Oral)   Resp 16   Wt 236 lb (107 kg)   SpO2 100%   BMI 35.88 kg/m  Wt Readings from Last 3 Encounters:  11/02/21 236 lb (107 kg)  09/14/21 238 lb 9.6 oz (108.2 kg)  08/26/21 236 lb (107 kg)       Assessment & Plan:   Problem List Items Addressed This Visit       Unprioritized   Anxiety and depression    Wt Readings from Last 3 Encounters:  11/02/21 236 lb (107 kg)  09/14/21 238 lb 9.6 oz (108.2 kg)  08/26/21 236 lb (107 kg)  Weight stable on sertraline 50mg . Mood and anxiety are much improved on current dose of sertraline.  No obvious side effects.  Continue current dose. Follow up with PCP in 3 months.      No orders of the defined types were placed in this encounter.   I, , NP, personally preformed the services described in this documentation.  All medical record entries made by the  scribe were at my direction and in my presence.  I have reviewed the chart and discharge instructions (if applicable) and agree that the record reflects my personal performance and is accurate and complete. 11/02/2021   I,Amber  Collins,acting as a Education administrator for Nance Pear, NP.,have documented all relevant documentation on the behalf of Nance Pear, NP,as directed by  Nance Pear, NP while in the presence of Nance Pear, NP.    Nance Pear, NP

## 2021-11-02 NOTE — Assessment & Plan Note (Signed)
Wt Readings from Last 3 Encounters:  11/02/21 236 lb (107 kg)  09/14/21 238 lb 9.6 oz (108.2 kg)  08/26/21 236 lb (107 kg)   Weight stable on sertraline 50mg . Mood and anxiety are much improved on current dose of sertraline.  No obvious side effects.  Continue current dose. Follow up with PCP in 3 months.

## 2021-11-09 ENCOUNTER — Ambulatory Visit (INDEPENDENT_AMBULATORY_CARE_PROVIDER_SITE_OTHER): Payer: 59 | Admitting: Psychology

## 2021-11-09 DIAGNOSIS — F411 Generalized anxiety disorder: Secondary | ICD-10-CM

## 2021-11-09 NOTE — Progress Notes (Signed)
[facility] Counselor Initial Adult Exam  Name: Diana Reed Date: 11/09/2021 MRN: [id number] DOB: Jul 03, 1995 PCP: Clayborne Dana, NP  Time spent: 11:00am - 11:50am   50 minutes  Guardian/Payee:  Donnamarie Poag requested: No   Reason for Visit /Presenting Problem: Pt present for face-to-face initial assessment via video Webex.  Pt consents to telehealth video session due to COVID 19 pandemic. Location of pt: home Location of therapist: home office.  Pt was referred by PCP for anxiety and depression.   Pt has had panic attacks in the evenings or when she wakes up.  When pt is not busy her mind races and anxiety increases.  Pt has had increased anxiety the past few months.   Pt's PCP prescribed Zoloft 50mg .   Pt has experienced benefit from the medication.   Pt tends to worry.  Pt gets hung up on conversations she had during the day and goes through her day and obsesses about things. Pt is not sure what has triggered the increase in anxiety.  Pt has some OCD behaviors of checking things.  She gets anxious when things go out of her routine.   Pt has trouble sleeping due to racing thoughts and anxiety.      Mental Status Exam: Appearance:   Casual     Behavior:  Appropriate  Motor:  Normal  Speech/Language:   Normal Rate  Affect:  Appropriate  Mood:  normal  Thought process:  normal  Thought content:    WNL  Sensory/Perceptual disturbances:    WNL  Orientation:  oriented to person, place, time/date, and situation  Attention:  Good  Concentration:  Good  Memory:  WNL  Fund of knowledge:   Good  Insight:    Good  Judgment:   Good  Impulse Control:  Good    Reported Symptoms:  anxiety  Risk Assessment: Danger to Self:  No Self-injurious Behavior: No Danger to Others: No Duty to Warn:no Physical Aggression / Violence:No  Access to Firearms a concern: No  Gang Involvement:No  Patient / guardian was educated about steps to take if suicide or homicide  risk level increases between visits: n/a While future psychiatric events cannot be accurately predicted, the patient does not currently require acute inpatient psychiatric care and does not currently meet [facility] involuntary commitment criteria.  Substance Abuse History: Current substance abuse: No     Past Psychiatric History:   No previous psychological problems have been observed Outpatient Providers:n/a History of Psych Hospitalization: No  Psychological Testing:  n/a    Abuse History:  Victim of: No.,  n/a    Report needed: No. Victim of Neglect:No. Perpetrator of  n/a   Witness / Exposure to Domestic Violence: No   Protective Services Involvement: No  Witness to [facility] Violence:  No   Family History:  Family History  Problem Relation Age of Onset   Depression Mother    Arthritis Mother    Hypertension Father    Healthy Father    Healthy Brother    Healthy Brother    Cervical cancer Maternal Aunt    Kidney disease Maternal Grandmother    Hypertension Maternal Grandmother    Cancer Maternal Grandmother    Arthritis Maternal Grandmother    Diabetes Maternal Grandmother    Colon cancer Maternal Grandmother    Heart disease Maternal Grandfather    Arthritis Maternal Grandfather    Heart attack Maternal Grandfather    Miscarriages / Stillbirths Paternal Grandmother  Cancer Paternal Grandmother    Arthritis Paternal Grandmother    Breast cancer Paternal Grandmother     Living situation: the patient lives alone.  Pt has 2 dogs.    Pt grew up with her parents who divorced when pt was 8.  Pt lived with her father and 2 brothers.   Pt is middle child.   Family history of mental health issues:  pt's mother has anxiety and depression.  Aunts have depression.  Cousin and uncle have bipolar disorder.  Grandmother had depression.    No family history of substance abuse or child abuse.   Pt is originally from [place] and most of her her family is still there.     Sexual Orientation: Bisexual  Relationship Status: single  Name of spouse / other:n/a If a parent, number of children / ages:n/a  Support Systems: parents.  Pt is close to her mom but she is also a source of stress.  Pt states she does not have a lot of friends and has trouble maintaining friendships.   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    Military Service: No   Educational History: Education: Risk manager: No religious affiliation.   Any cultural differences that may affect / interfere with treatment:  not applicable   Recreation/Hobbies: pt likes to read, take walks with dogs.   Stressors: Other: pt states family issues and change create the most stress for her.      Strengths: Able to Communicate Effectively.  Pt is independent.  Pt states she is "somewhat resilient".    Barriers:  n/a   Legal History: Pending legal issue / charges: The patient has no significant history of legal issues. History of legal issue / charges:  n/a  Medical History/Surgical History: reviewed Past Medical History:  Diagnosis Date   Allergy    Anxiety    Depression    Psoriasis     Past Surgical History:  Procedure Laterality Date   ORIF HUMERUS FRACTURE Left 01/08/2020   Procedure: OPEN REDUCTION INTERNAL FIXATION (ORIF) HUMERAL SHAFT FRACTURE;  Surgeon: Myrene Galas, MD;  Location: [facility] OR;  Service: Orthopedics;  Laterality: Left;   WISDOM TOOTH EXTRACTION      Medications: Current Outpatient Medications  Medication Sig Dispense Refill   clobetasol (TEMOVATE) 0.05 % external solution Apply 1 Application topically 2 (two) times daily. 50 mL 0   LO LOESTRIN FE 1 MG-10 MCG / 10 MCG tablet Take 1 tablet by mouth daily. 28 tablet 6   ondansetron (ZOFRAN-ODT) 4 MG disintegrating tablet Take 1 tablet (4 mg total) by mouth every 8 (eight) hours as needed for nausea  or vomiting. 15 tablet 0   sertraline (ZOLOFT) 50 MG tablet Take 1 tablet (50 mg total) by mouth daily. 30 tablet 3   No current facility-administered medications for this visit.    Allergies  Allergen Reactions   Other     Eggplant-swollen,itchy tongue & some swelling/itching of throat    Diagnoses:  F41.1  Plan of Care: Recommend ongoing therapy.   Pt participated in setting treatment goals.  Pt would like to increase coping skills.  Plan to meet every two weeks.    Treatment Plan Client Abilities/Strengths  Pt is bright, engaging and motivated for therapy.  Client Treatment Preferences  Individual therapy.  Client Statement of Needs  Improve coping skills.  Symptoms  Autonomic hyperactivity (e.g., palpitations, shortness of breath, dry mouth,  trouble swallowing, nausea, diarrhea). Excessive and/or unrealistic worry that is difficult to control occurring more days than not for at least 6 months about a number of events or activities. Hypervigilance (e.g., feeling constantly on edge, experiencing concentration difficulties, having trouble falling or staying asleep, exhibiting a general state of irritability). Motor tension (e.g., restlessness, tiredness, shakiness, muscle tension). Problems Addressed  Anxiety Goals 1. Enhance ability to effectively cope with the full variety of life's worries and anxieties. 2. Learn and implement coping skills that result in a reduction of anxiety and worry, and improved daily functioning. Objective Learn to accept limitations in life and commit to tolerating, rather than avoiding, unpleasant emotions while accomplishing meaningful goals. Target Date: 2022-11-10      Frequency: Biweekly Progress: 10 Modality: individual Related Interventions 1. Use techniques from Acceptance and Commitment Therapy to help client accept uncomfortable realities such as lack of complete control, imperfections, and uncertainty and tolerate unpleasant emotions and  thoughts in order to accomplish value-consistent goals. Objective Learn and implement problem-solving strategies for realistically addressing worries. Target Date: 2022-11-10   Frequency: Biweekly Progress: 10 Modality: individual Related Interventions 1. Assign the client a homework exercise in which he/she problem-solves a current problem.  review, reinforce success, and provide corrective feedback toward improvement. 2. Teach the client problem-solving strategies involving specifically defining a problem, generating options for addressing it, evaluating the pros and cons of each option, selecting and implementing an optional action, and reevaluating and refining the action. Objective Learn and implement calming skills to reduce overall anxiety and manage anxiety symptoms. Target Date: 2022-11-10  Frequency: Biweekly Progress: 10 Modality: individual Related Interventions 1. Assign the client to read about progressive muscle relaxation and other calming strategies in relevant books or treatment manuals (e.g., Progressive Relaxation Training by Robb Matar and Alen Blew; Mastery of Your Anxiety and Worry: Workbook by Earlie Counts). 2. Assign the client homework each session in which he/she practices relaxation exercises daily, gradually applying them progressively from non-anxiety-provoking to anxiety-provoking situations; review and reinforce success while providing corrective feedback toward improvement. 3. Teach the client calming/relaxation skills (e.g., applied relaxation, progressive muscle relaxation, cue controlled relaxation; mindful breathing; biofeedback) and how to discriminate better between relaxation and tension; teach the client how to apply these skills to his/her daily life. 3. Reduce overall frequency, intensity, and duration of the anxiety so that daily functioning is not impaired. 4. Resolve the core conflict that is the source of anxiety. 5. Stabilize anxiety level while  increasing ability to function on a daily basis. Diagnosis :    F41.1  Generalized Anxiety Disorder  Conditions For Discharge Achievement of treatment goals and objectives.   Ambera Fedele, LCSW

## 2021-11-16 ENCOUNTER — Ambulatory Visit (INDEPENDENT_AMBULATORY_CARE_PROVIDER_SITE_OTHER): Payer: 59 | Admitting: Psychology

## 2021-11-16 DIAGNOSIS — F411 Generalized anxiety disorder: Secondary | ICD-10-CM

## 2021-11-16 NOTE — Progress Notes (Signed)
[place] Counselor/Therapist Progress Note  Patient ID: Diana Reed, MRN: [id number],    Date: 11/16/2021  Time Spent: 3:00pm - 3:50pm       Treatment Type: Individual Therapy  Reported Symptoms: anxiety  Mental Status Exam: Appearance:  Well Groomed     Behavior: Appropriate  Motor: Normal  Speech/Language:  Normal Rate  Affect: Appropriate  Mood: normal  Thought process: normal  Thought content:   WNL  Sensory/Perceptual disturbances:   WNL  Orientation: oriented to person, place, time/date, and situation  Attention: Good  Concentration: Good  Memory: WNL  Fund of knowledge:  Good  Insight:   Good  Judgment:  Good  Impulse Control: Good   Risk Assessment: Danger to Self:  No Self-injurious Behavior: No Danger to Others: No Duty to Warn:no Physical Aggression / Violence:No  Access to Firearms a concern: No  Gang Involvement:No   Subjective: Pt present for face to face individual therapy in person. Pt states she is anxious about the planned visit with her family up [place] next week.  Pt is anxious about driving there by herself and about the family dynamics.   Pt has bad memories in [place].  The last 2 years she was there she was very depressed.   Helped pt process her feelings and family dynamics.   Worked on calming strategies.    Worked with pt on making a Surveyor, quantity.    Pt has used the CALM app.  She has also journaled her worry thoughts.   Encouraged pt to continue to journal.   Pt has been sleeping a little better.   She sleeps about 4-5 hours a night.  Provided supportive therapy.    Interventions: Cognitive Behavioral Therapy and Insight-Oriented  Diagnosis:  F41.1  Plan of Care: Recommend ongoing therapy.   Pt participated in setting treatment goals.  Pt would like to increase coping skills.  Plan to meet every two weeks.    Treatment Plan (Treatment Plan target date :  11/10/2022) Client Abilities/Strengths  Pt  is bright, engaging and motivated for therapy.  Client Treatment Preferences  Individual therapy.  Client Statement of Needs  Improve coping skills.  Symptoms  Autonomic hyperactivity (e.g., palpitations, shortness of breath, dry mouth, trouble swallowing, nausea, diarrhea). Excessive and/or unrealistic worry that is difficult to control occurring more days than not for at least 6 months about a number of events or activities. Hypervigilance (e.g., feeling constantly on edge, experiencing concentration difficulties, having trouble falling or staying asleep, exhibiting a general state of irritability). Motor tension (e.g., restlessness, tiredness, shakiness, muscle tension). Problems Addressed  Anxiety Goals 1. Enhance ability to effectively cope with the full variety of life's worries and anxieties. 2. Learn and implement coping skills that result in a reduction of anxiety and worry, and improved daily functioning. Objective Learn to accept limitations in life and commit to tolerating, rather than avoiding, unpleasant emotions while accomplishing meaningful goals. Target Date: 2022-11-10      Frequency: Biweekly Progress: 10 Modality: individual Related Interventions 1. Use techniques from Acceptance and Commitment Therapy to help client accept uncomfortable realities such as lack of complete control, imperfections, and uncertainty and tolerate unpleasant emotions and thoughts in order to accomplish value-consistent goals. Objective Learn and implement problem-solving strategies for realistically addressing worries. Target Date: 2022-11-10   Frequency: Biweekly Progress: 10 Modality: individual Related Interventions 1. Assign the client a homework exercise in which he/she problem-solves a current problem.  review, reinforce success, and provide corrective feedback  toward improvement. 2. Teach the client problem-solving strategies involving specifically defining a problem, generating options  for addressing it, evaluating the pros and cons of each option, selecting and implementing an optional action, and reevaluating and refining the action. Objective Learn and implement calming skills to reduce overall anxiety and manage anxiety symptoms. Target Date: 2022-11-10  Frequency: Biweekly Progress: 10 Modality: individual Related Interventions 1. Assign the client to read about progressive muscle relaxation and other calming strategies in relevant books or treatment manuals (e.g., Progressive Relaxation Training by Gwynneth Aliment and Dani Gobble; Mastery of Your Anxiety and Worry: Workbook by Beckie Busing). 2. Assign the client homework each session in which he/she practices relaxation exercises daily, gradually applying them progressively from non-anxiety-provoking to anxiety-provoking situations; review and reinforce success while providing corrective feedback toward improvement. 3. Teach the client calming/relaxation skills (e.g., applied relaxation, progressive muscle relaxation, cue controlled relaxation; mindful breathing; biofeedback) and how to discriminate better between relaxation and tension; teach the client how to apply these skills to his/her daily life. 3. Reduce overall frequency, intensity, and duration of the anxiety so that daily functioning is not impaired. 4. Resolve the core conflict that is the source of anxiety. 5. Stabilize anxiety level while increasing ability to function on a daily basis. Diagnosis :    F41.1  Generalized Anxiety Disorder  Conditions For Discharge Achievement of treatment goals and objectives.  Akaylah Lalley, LCSW

## 2021-12-07 ENCOUNTER — Ambulatory Visit (INDEPENDENT_AMBULATORY_CARE_PROVIDER_SITE_OTHER): Payer: 59 | Admitting: Psychology

## 2021-12-07 DIAGNOSIS — F411 Generalized anxiety disorder: Secondary | ICD-10-CM
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
## 2021-12-28 ENCOUNTER — Ambulatory Visit (INDEPENDENT_AMBULATORY_CARE_PROVIDER_SITE_OTHER): Payer: 59 | Admitting: Psychology

## 2021-12-28 DIAGNOSIS — F411 Generalized anxiety disorder: Secondary | ICD-10-CM

## 2021-12-28 NOTE — Progress Notes (Signed)
[facility] Counselor/Therapist Progress Note  Patient ID: Diana Reed, MRN: [id number],    Date: 12/28/2021  Time Spent: 4:00pm - 4:50pm   50 minutes      Treatment Type: Individual Therapy  Reported Symptoms: anxiety  Mental Status Exam: Appearance:  Well Groomed     Behavior: Appropriate  Motor: Normal  Speech/Language:  Normal Rate  Affect: Appropriate  Mood: normal  Thought process: normal  Thought content:   WNL  Sensory/Perceptual disturbances:   WNL  Orientation: oriented to person, place, time/date, and situation  Attention: Good  Concentration: Good  Memory: WNL  Fund of knowledge:  Good  Insight:   Good  Judgment:  Good  Impulse Control: Good   Risk Assessment: Danger to Self:  No Self-injurious Behavior: No Danger to Others: No Duty to Warn:no Physical Aggression / Violence:No  Access to Firearms a concern: No  Gang Involvement:No   Subjective: Pt present for face to face individual therapy in person. Pt talked about this time of year being hard for her.  Pt's grandfather and cousin died this time of year and pt does not like heading into winter.  Pt's cousin was only a year older than pt when she died.   Pt's cousin died 3 years ago.  Pt was close to her cousin.   Pt could not go to the funeral bc of the COVID shutdown at the time.  Pt had 2 other cousins who died young and unexpectedly as well.   Helped pt process her feelings and grief.   Pt tends to lay in bed a lot.  She has trouble feeling motivated to do things.   Worked on behavioral activation strategies.   Pt talked about her mother calling and telling her her uncle has cancer.   Pt talked about planning to have her brother and family visit in the next couple of weeks.   Pt states her brother and mother both have strong personalities so at times the family dynamics can get stressful.  Worked on self care.   Encouraged pt to continue to journal.   Provided supportive  therapy.    Interventions: Cognitive Behavioral Therapy and Insight-Oriented  Diagnosis:  F41.1  Plan of Care: Recommend ongoing therapy.   Pt participated in setting treatment goals.  Pt would like to increase coping skills.  Plan to meet every two weeks.    Treatment Plan (Treatment Plan target date :  11/10/2022) Client Abilities/Strengths  Pt is bright, engaging and motivated for therapy.  Client Treatment Preferences  Individual therapy.  Client Statement of Needs  Improve coping skills.  Symptoms  Autonomic hyperactivity (e.g., palpitations, shortness of breath, dry mouth, trouble swallowing, nausea, diarrhea). Excessive and/or unrealistic worry that is difficult to control occurring more days than not for at least 6 months about a number of events or activities. Hypervigilance (e.g., feeling constantly on edge, experiencing concentration difficulties, having trouble falling or staying asleep, exhibiting a general state of irritability). Motor tension (e.g., restlessness, tiredness, shakiness, muscle tension). Problems Addressed  Anxiety Goals 1. Enhance ability to effectively cope with the full variety of life's worries and anxieties. 2. Learn and implement coping skills that result in a reduction of anxiety and worry, and improved daily functioning. Objective Learn to accept limitations in life and commit to tolerating, rather than avoiding, unpleasant emotions while accomplishing meaningful goals. Target Date: 2022-11-10      Frequency: Biweekly Progress: 10 Modality: individual Related Interventions 1. Use techniques from Acceptance and Commitment Therapy  to help client accept uncomfortable realities such as lack of complete control, imperfections, and uncertainty and tolerate unpleasant emotions and thoughts in order to accomplish value-consistent goals. Objective Learn and implement problem-solving strategies for realistically addressing worries. Target Date: 2022-11-10    Frequency: Biweekly Progress: 10 Modality: individual Related Interventions 1. Assign the client a homework exercise in which he/she problem-solves a current problem.  review, reinforce success, and provide corrective feedback toward improvement. 2. Teach the client problem-solving strategies involving specifically defining a problem, generating options for addressing it, evaluating the pros and cons of each option, selecting and implementing an optional action, and reevaluating and refining the action. Objective Learn and implement calming skills to reduce overall anxiety and manage anxiety symptoms. Target Date: 2022-11-10  Frequency: Biweekly Progress: 10 Modality: individual Related Interventions 1. Assign the client to read about progressive muscle relaxation and other calming strategies in relevant books or treatment manuals (e.g., Progressive Relaxation Training by Robb Matar and Alen Blew; Mastery of Your Anxiety and Worry: Workbook by Earlie Counts). 2. Assign the client homework each session in which he/she practices relaxation exercises daily, gradually applying them progressively from non-anxiety-provoking to anxiety-provoking situations; review and reinforce success while providing corrective feedback toward improvement. 3. Teach the client calming/relaxation skills (e.g., applied relaxation, progressive muscle relaxation, cue controlled relaxation; mindful breathing; biofeedback) and how to discriminate better between relaxation and tension; teach the client how to apply these skills to his/her daily life. 3. Reduce overall frequency, intensity, and duration of the anxiety so that daily functioning is not impaired. 4. Resolve the core conflict that is the source of anxiety. 5. Stabilize anxiety level while increasing ability to function on a daily basis. Diagnosis :    F41.1  Generalized Anxiety Disorder  Conditions For Discharge Achievement of treatment goals and  objectives.  Kaytee Taliercio, LCSW

## 2022-01-10 ENCOUNTER — Telehealth: Payer: Self-pay | Admitting: Family Medicine

## 2022-01-10 NOTE — Telephone Encounter (Signed)
Patient wants to get hepatitis shot. Please place order for shot and call patient to schedule.

## 2022-01-11 ENCOUNTER — Other Ambulatory Visit: Payer: Self-pay | Admitting: Family Medicine

## 2022-01-11 NOTE — Telephone Encounter (Signed)
Okay to schedule or would you like to run titers first? Please advise.

## 2022-01-12 NOTE — Telephone Encounter (Signed)
LMOM for pt to call back to schedule nurse visit.

## 2022-02-01 ENCOUNTER — Encounter: Payer: Self-pay | Admitting: Family Medicine

## 2022-02-01 ENCOUNTER — Ambulatory Visit (INDEPENDENT_AMBULATORY_CARE_PROVIDER_SITE_OTHER): Payer: Managed Care, Other (non HMO) | Admitting: Family Medicine

## 2022-02-01 VITALS — BP 121/80 | HR 80 | Temp 97.7°F | Resp 18 | Ht 68.0 in | Wt 249.4 lb

## 2022-02-01 DIAGNOSIS — F419 Anxiety disorder, unspecified: Secondary | ICD-10-CM | POA: Diagnosis not present

## 2022-02-01 DIAGNOSIS — F32A Depression, unspecified: Secondary | ICD-10-CM | POA: Diagnosis not present

## 2022-02-01 MED ORDER — BUPROPION HCL ER (XL) 150 MG PO TB24: 150.0000 mg | ORAL_TABLET | Freq: Every day | ORAL | 3 refills | Status: DC

## 2022-02-01 MED ORDER — SERTRALINE HCL 50 MG PO TABS: 50.0000 mg | ORAL_TABLET | Freq: Every day | ORAL | 3 refills | Status: DC

## 2022-02-01 NOTE — Progress Notes (Signed)
Established Patient Office Visit  Subjective   Patient ID: Diana Reed, female    DOB: 1995-11-08  Age: 26 y.o. MRN: [id number]  CC: mood f/u   HPI  Patient is here for follow-up on anxiety/depression. At last visit on 11/02/21 with Sandford Craze she was doing well on Zoloft 50 mg daily and PRN hydroxyzine. She denies any SI/HI. She has noticed weight gain, but otherwise no side effects. Reports she has not changed her diet or activity level. This time of year is difficult for her in general. Not getting much daylight due to work schedule. She has several "death anniversaries" around this time.         02/01/2022    8:56 AM 09/14/2021    8:30 AM 08/03/2021    9:14 AM  PHQ9 SCORE ONLY  PHQ-9 Total Score 14 11 12       02/01/2022    8:57 AM 09/14/2021    8:30 AM 08/03/2021    9:15 AM 08/15/2016   10:14 AM  GAD 7 : Generalized Anxiety Score  Nervous, Anxious, on Edge 2 1 3 1   Control/stop worrying 2 1 3 2   Worry too much - different things 2 1 3 1   Trouble relaxing 2 1 2 1   Restless 1 0 2 0  Easily annoyed or irritable 1 1 2  0  Afraid - awful might happen 1 0 1 1  Total GAD 7 Score 11 5 16 6   Anxiety Difficulty Somewhat difficult Somewhat difficult Somewhat difficult Not difficult at all        ROS All review of systems negative except what is listed in the HPI     Objective:     BP 121/80   Pulse 80   Temp 97.7 F (36.5 C)   Resp 18   Ht 5\' 8"  (1.727 m)   Wt 249 lb 6.4 oz (113.1 kg)   SpO2 100%   BMI 37.92 kg/m    Physical Exam Vitals reviewed.  Constitutional:      Appearance: Normal appearance. She is obese.  HENT:     Head: Normocephalic and atraumatic.  Cardiovascular:     Rate and Rhythm: Normal rate and regular rhythm.  Pulmonary:     Effort: Pulmonary effort is normal.     Breath sounds: Normal breath sounds.  Neurological:     General: No focal deficit present.     Mental Status: She is alert and oriented to person, place, and  time. Mental status is at baseline.  Psychiatric:        Mood and Affect: Mood normal.        Behavior: Behavior normal.        Thought Content: Thought content normal.        Judgment: Judgment normal.      No results found for any visits on 02/01/22.    The ASCVD Risk score (Arnett DK, et al., 2019) failed to calculate for the following reasons:   The 2019 ASCVD risk score is only valid for ages 87 to 75    Assessment & Plan:   Anxiety and depression Mood is slightly worse, but no SI/HI.  Noticeable weight gain on Zoloft 50 mg.  Hesitant to increase Zoloft due to weight gain. Discussed options with patient.  Agreed to continue Zoloft at current dose for now and add low-dose Wellbutrin to see if mood improves. If stable, at next visit we can consider decreasing Zoloft to try to stabilize weight. Continue healthy lifestyle  measures.  Wt Readings from Last 3 Encounters:  02/01/22 249 lb 6.4 oz (113.1 kg)  11/02/21 236 lb (107 kg)  09/14/21 238 lb 9.6 oz (108.2 kg)    Meds ordered this encounter  Medications   buPROPion (WELLBUTRIN XL) 150 MG 24 hr tablet    Sig: Take 1 tablet (150 mg total) by mouth daily.    Dispense:  30 tablet    Refill:  3    Order Specific Question:   Supervising Provider    Answer:   Danise Edge A [[id number]]   sertraline (ZOLOFT) 50 MG tablet    Sig: Take 1 tablet (50 mg total) by mouth daily.    Dispense:  30 tablet    Refill:  3    Order Specific Question:   Supervising Provider    Answer:   Danise Edge A [[id number]]      Return in about 6 weeks (around 03/15/2022) for mood f/u.    Clayborne Dana, NP

## 2022-02-01 NOTE — Assessment & Plan Note (Signed)
Mood is slightly worse, but no SI/HI.  Noticeable weight gain on Zoloft 50 mg.  Hesitant to increase Zoloft due to weight gain. Discussed options with patient.  Agreed to continue Zoloft at current dose for now and add low-dose Wellbutrin to see if mood improves. If stable, at next visit we can consider decreasing Zoloft to try to stabilize weight. Continue healthy lifestyle measures.  Wt Readings from Last 3 Encounters:  02/01/22 249 lb 6.4 oz (113.1 kg)  11/02/21 236 lb (107 kg)  09/14/21 238 lb 9.6 oz (108.2 kg)

## 2022-02-08 ENCOUNTER — Ambulatory Visit (INDEPENDENT_AMBULATORY_CARE_PROVIDER_SITE_OTHER): Payer: 59 | Admitting: Psychology

## 2022-02-08 DIAGNOSIS — F411 Generalized anxiety disorder: Secondary | ICD-10-CM | POA: Diagnosis not present

## 2022-02-08 NOTE — Progress Notes (Signed)
[facility] Counselor/Therapist Progress Note  Patient ID: Diana Reed, MRN: [id number],    Date: 02/08/2022  Time Spent: 3:00pm - 3:50pm   50 minutes      Treatment Type: Individual Therapy  Reported Symptoms: anxiety  Mental Status Exam: Appearance:  Well Groomed     Behavior: Appropriate  Motor: Normal  Speech/Language:  Normal Rate  Affect: Appropriate  Mood: normal  Thought process: normal  Thought content:   WNL  Sensory/Perceptual disturbances:   WNL  Orientation: oriented to person, place, time/date, and situation  Attention: Good  Concentration: Good  Memory: WNL  Fund of knowledge:  Good  Insight:   Good  Judgment:  Good  Impulse Control: Good   Risk Assessment: Danger to Self:  No Self-injurious Behavior: No Danger to Others: No Duty to Warn:no Physical Aggression / Violence:No  Access to Firearms a concern: No  Gang Involvement:No   Subjective: Pt present for face to face individual therapy in person. Pt talked about her Thanksgiving holiday.  She was with family and friends.  It was a nice time. Pt had a Christmas party with a group of friends and there was "drama".   Addressed the dynamics and helped pt process her feelings and the relationship dynamics.   Pt talked about her relationship with her mother.  Pt's mother confides in pt a lot and it can be stressful for pt.   At times it feels like a role reversal to pt.   Pt talked about work.  Work has been more stressful than normal bc of mold exposure.   Pt tends to want to lay in bed a lot but she is making herself get up.  She has trouble feeling motivated to do things.   Worked on behavioral activation strategies.   Pt talked about her anxiety.  She had a panic attack one night.  Pt is prescribed Zoloft and her PCP added Welbutrin.  Pt states she uses imagery and creating movies in her head to cope and self soothe.  At times this is good for her but sometimes she gets "stuck in her  thoughts" and it keeps her from doing things she needs to do.   Worked on creating balance regarding pt's day dreaming.  Worked on self care.   Encouraged pt to continue to journal.   Provided supportive therapy.    Interventions: Cognitive Behavioral Therapy and Insight-Oriented  Diagnosis:  F41.1  Plan of Care: Recommend ongoing therapy.   Pt participated in setting treatment goals.  Pt would like to increase coping skills.  Plan to meet every two weeks.    Treatment Plan (Treatment Plan target date :  11/10/2022) Client Abilities/Strengths  Pt is bright, engaging and motivated for therapy.  Client Treatment Preferences  Individual therapy.  Client Statement of Needs  Improve coping skills.  Symptoms  Autonomic hyperactivity (e.g., palpitations, shortness of breath, dry mouth, trouble swallowing, nausea, diarrhea). Excessive and/or unrealistic worry that is difficult to control occurring more days than not for at least 6 months about a number of events or activities. Hypervigilance (e.g., feeling constantly on edge, experiencing concentration difficulties, having trouble falling or staying asleep, exhibiting a general state of irritability). Motor tension (e.g., restlessness, tiredness, shakiness, muscle tension). Problems Addressed  Anxiety Goals 1. Enhance ability to effectively cope with the full variety of life's worries and anxieties. 2. Learn and implement coping skills that result in a reduction of anxiety and worry, and improved daily functioning. Objective Learn to accept  limitations in life and commit to tolerating, rather than avoiding, unpleasant emotions while accomplishing meaningful goals. Target Date: 2022-11-10      Frequency: Biweekly Progress: 10 Modality: individual Related Interventions 1. Use techniques from Acceptance and Commitment Therapy to help client accept uncomfortable realities such as lack of complete control, imperfections, and uncertainty and tolerate  unpleasant emotions and thoughts in order to accomplish value-consistent goals. Objective Learn and implement problem-solving strategies for realistically addressing worries. Target Date: 2022-11-10   Frequency: Biweekly Progress: 10 Modality: individual Related Interventions 1. Assign the client a homework exercise in which he/she problem-solves a current problem.  review, reinforce success, and provide corrective feedback toward improvement. 2. Teach the client problem-solving strategies involving specifically defining a problem, generating options for addressing it, evaluating the pros and cons of each option, selecting and implementing an optional action, and reevaluating and refining the action. Objective Learn and implement calming skills to reduce overall anxiety and manage anxiety symptoms. Target Date: 2022-11-10  Frequency: Biweekly Progress: 10 Modality: individual Related Interventions 1. Assign the client to read about progressive muscle relaxation and other calming strategies in relevant books or treatment manuals (e.g., Progressive Relaxation Training by Robb Matar and Alen Blew; Mastery of Your Anxiety and Worry: Workbook by Earlie Counts). 2. Assign the client homework each session in which he/she practices relaxation exercises daily, gradually applying them progressively from non-anxiety-provoking to anxiety-provoking situations; review and reinforce success while providing corrective feedback toward improvement. 3. Teach the client calming/relaxation skills (e.g., applied relaxation, progressive muscle relaxation, cue controlled relaxation; mindful breathing; biofeedback) and how to discriminate better between relaxation and tension; teach the client how to apply these skills to his/her daily life. 3. Reduce overall frequency, intensity, and duration of the anxiety so that daily functioning is not impaired. 4. Resolve the core conflict that is the source of anxiety. 5.  Stabilize anxiety level while increasing ability to function on a daily basis. Diagnosis :    F41.1  Generalized Anxiety Disorder  Conditions For Discharge Achievement of treatment goals and objectives.  Naava Janeway, LCSW

## 2022-03-01 ENCOUNTER — Ambulatory Visit (INDEPENDENT_AMBULATORY_CARE_PROVIDER_SITE_OTHER): Payer: 59 | Admitting: Psychology

## 2022-03-01 DIAGNOSIS — F411 Generalized anxiety disorder: Secondary | ICD-10-CM

## 2022-03-01 NOTE — Progress Notes (Signed)
[facility] Counselor/Therapist Progress Note  Patient ID: Diana Reed, MRN: [id number],    Date: 03/01/2022  Time Spent: 4:00pm - 4:50pm   50 minutes      Treatment Type: Individual Therapy  Reported Symptoms: anxiety  Mental Status Exam: Appearance:  Well Groomed     Behavior: Appropriate  Motor: Normal  Speech/Language:  Normal Rate  Affect: Appropriate  Mood: normal  Thought process: normal  Thought content:   WNL  Sensory/Perceptual disturbances:   WNL  Orientation: oriented to person, place, time/date, and situation  Attention: Good  Concentration: Good  Memory: WNL  Fund of knowledge:  Good  Insight:   Good  Judgment:  Good  Impulse Control: Good   Risk Assessment: Danger to Self:  No Self-injurious Behavior: No Danger to Others: No Duty to Warn:no Physical Aggression / Violence:No  Access to Firearms a concern: No  Gang Involvement:No   Subjective: Pt present for face to face individual therapy in person. Pt talked about the holidays.   She states she had a nice Christmas. Pt states she has been feeling better and she has been sleeping better.   Pt has been engaged more in activities during the day.  She feels the addition of Welbutrin has helped her. Pt talked about having a panic attack when her brother was visiting.  The environment got over stimulating.  Addressed how pt coped.  She utilized calming strategies.  Pt talked about daydreaming a lot.  She will daydream at work at times and also when at home.  Identified that pt daydreams when she is either under or over stimulated.  Addressed how pt uses daydreaming to cope and identified that there are times when it serves her well and other times when it doesn't.   She uses it to avoid social interaction when overstimulated.   Worked on healthy coping strategies.  Worked on self care.    Provided supportive therapy.    Interventions: Cognitive Behavioral Therapy and  Insight-Oriented  Diagnosis:  F41.1  Plan of Care: Recommend ongoing therapy.   Pt participated in setting treatment goals.  Pt would like to increase coping skills.  Plan to meet every two weeks.    Treatment Plan (Treatment Plan target date :  11/10/2022) Client Abilities/Strengths  Pt is bright, engaging and motivated for therapy.  Client Treatment Preferences  Individual therapy.  Client Statement of Needs  Improve coping skills.  Symptoms  Autonomic hyperactivity (e.g., palpitations, shortness of breath, dry mouth, trouble swallowing, nausea, diarrhea). Excessive and/or unrealistic worry that is difficult to control occurring more days than not for at least 6 months about a number of events or activities. Hypervigilance (e.g., feeling constantly on edge, experiencing concentration difficulties, having trouble falling or staying asleep, exhibiting a general state of irritability). Motor tension (e.g., restlessness, tiredness, shakiness, muscle tension). Problems Addressed  Anxiety Goals 1. Enhance ability to effectively cope with the full variety of life's worries and anxieties. 2. Learn and implement coping skills that result in a reduction of anxiety and worry, and improved daily functioning. Objective Learn to accept limitations in life and commit to tolerating, rather than avoiding, unpleasant emotions while accomplishing meaningful goals. Target Date: 2022-11-10      Frequency: Biweekly Progress: 10 Modality: individual Related Interventions 1. Use techniques from Acceptance and Commitment Therapy to help client accept uncomfortable realities such as lack of complete control, imperfections, and uncertainty and tolerate unpleasant emotions and thoughts in order to accomplish value-consistent goals. Objective Learn and implement  problem-solving strategies for realistically addressing worries. Target Date: 2022-11-10   Frequency: Biweekly Progress: 10 Modality: individual Related  Interventions 1. Assign the client a homework exercise in which he/she problem-solves a current problem.  review, reinforce success, and provide corrective feedback toward improvement. 2. Teach the client problem-solving strategies involving specifically defining a problem, generating options for addressing it, evaluating the pros and cons of each option, selecting and implementing an optional action, and reevaluating and refining the action. Objective Learn and implement calming skills to reduce overall anxiety and manage anxiety symptoms. Target Date: 2022-11-10  Frequency: Biweekly Progress: 10 Modality: individual Related Interventions 1. Assign the client to read about progressive muscle relaxation and other calming strategies in relevant books or treatment manuals (e.g., Progressive Relaxation Training by Gwynneth Aliment and Dani Gobble; Mastery of Your Anxiety and Worry: Workbook by Beckie Busing). 2. Assign the client homework each session in which he/she practices relaxation exercises daily, gradually applying them progressively from non-anxiety-provoking to anxiety-provoking situations; review and reinforce success while providing corrective feedback toward improvement. 3. Teach the client calming/relaxation skills (e.g., applied relaxation, progressive muscle relaxation, cue controlled relaxation; mindful breathing; biofeedback) and how to discriminate better between relaxation and tension; teach the client how to apply these skills to his/her daily life. 3. Reduce overall frequency, intensity, and duration of the anxiety so that daily functioning is not impaired. 4. Resolve the core conflict that is the source of anxiety. 5. Stabilize anxiety level while increasing ability to function on a daily basis. Diagnosis :    F41.1  Generalized Anxiety Disorder  Conditions For Discharge Achievement of treatment goals and objectives.  Dotti Busey, LCSW

## 2022-03-08 ENCOUNTER — Encounter: Payer: Self-pay | Admitting: Family Medicine

## 2022-03-08 ENCOUNTER — Ambulatory Visit: Payer: Managed Care, Other (non HMO) | Admitting: Family Medicine

## 2022-03-08 VITALS — BP 118/50 | HR 90 | Temp 98.1°F | Resp 16 | Ht 68.0 in | Wt 244.0 lb

## 2022-03-08 DIAGNOSIS — F32A Depression, unspecified: Secondary | ICD-10-CM | POA: Diagnosis not present

## 2022-03-08 DIAGNOSIS — F419 Anxiety disorder, unspecified: Secondary | ICD-10-CM | POA: Diagnosis not present

## 2022-03-08 NOTE — Patient Instructions (Signed)
Glad you are doing so well! Continue current regimen and follow-up at your physical unless you need me sooner!

## 2022-03-08 NOTE — Progress Notes (Signed)
Established Patient Office Visit  Subjective   Patient ID: Diana Reed, female    DOB: 02/04/1996  Age: 27 y.o. MRN: [id number]  CC: mood f/u   HPI  Patient is here for follow-up on anxiety/depression.   Mood follow-up: - Dx: depression and anxiety - Treatment: Zoloft 50 mg daily, Wellbutrin 150 mg daily - Medication side effects: none, previously noted weight gain on Zoloft, but has now starting losing weight - SI/HI: none - Update: Today she reports that she has been doing so much better since adding the Wellbutrin last visit. She would like to continue on current dose for now since it is working so well. Mood has been significantly improved and she is sleeping better. She has also felt motivated to start exercising (gym 3 days/week) and has lost about 5 pounds since our last visit!   Wt Readings from Last 3 Encounters:  03/08/22 244 lb (110.7 kg)  02/01/22 249 lb 6.4 oz (113.1 kg)  11/02/21 236 lb (107 kg)         03/08/2022    9:50 AM 02/01/2022    8:56 AM 09/14/2021    8:30 AM  PHQ9 SCORE ONLY  PHQ-9 Total Score 8 14 11       03/08/2022    9:21 AM 02/01/2022    8:57 AM 09/14/2021    8:30 AM 08/03/2021    9:15 AM  GAD 7 : Generalized Anxiety Score  Nervous, Anxious, on Edge 1 2 1 3   Control/stop worrying 1 2 1 3   Worry too much - different things 2 2 1 3   Trouble relaxing 1 2 1 2   Restless 1 1 0 2  Easily annoyed or irritable 1 1 1 2   Afraid - awful might happen  1 0 1  Total GAD 7 Score  11 5 16   Anxiety Difficulty Not difficult at all Somewhat difficult Somewhat difficult Somewhat difficult        ROS All review of systems negative except what is listed in the HPI     Objective:     BP (!) 118/50   Pulse 90   Temp 98.1 F (36.7 C)   Resp 16   Ht 5\' 8"  (1.727 m)   Wt 244 lb (110.7 kg)   SpO2 90%   BMI 37.10 kg/m    Physical Exam Vitals reviewed.  Constitutional:      Appearance: Normal appearance. She is obese.  HENT:     Head:  Normocephalic and atraumatic.  Cardiovascular:     Rate and Rhythm: Normal rate and regular rhythm.  Pulmonary:     Effort: Pulmonary effort is normal.     Breath sounds: Normal breath sounds.  Neurological:     General: No focal deficit present.     Mental Status: She is alert and oriented to person, place, and time. Mental status is at baseline.  Psychiatric:        Mood and Affect: Mood normal.        Behavior: Behavior normal.        Thought Content: Thought content normal.        Judgment: Judgment normal.      No results found for any visits on 03/08/22.    The ASCVD Risk score (Arnett DK, et al., 2019) failed to calculate for the following reasons:   The 2019 ASCVD risk score is only valid for ages 25 to 72    Assessment & Plan:   Anxiety and depression Mood is  stable. Feeling much better overall.  Liking current regimen (Zoloft 50 mg daily + Wellbutrin 150 mg daily). No desire to make changes today. No adverse effects.  No SI/HI Weight is starting to improve now that she has been exercising regularly Follow-up in 3-6 months or sooner if needed  No orders of the defined types were placed in this encounter.     Return if symptoms worsen or fail to improve.    Terrilyn Saver, NP

## 2022-03-08 NOTE — Assessment & Plan Note (Signed)
Mood is stable. Feeling much better overall.  Liking current regimen (Zoloft 50 mg daily + Wellbutrin 150 mg daily). No desire to make changes today. No adverse effects.  No SI/HI Weight is starting to improve now that she has been exercising regularly Follow-up in 3-6 months or sooner if needed

## 2022-03-15 ENCOUNTER — Ambulatory Visit: Payer: 59 | Admitting: Psychology

## 2022-03-29 ENCOUNTER — Ambulatory Visit (INDEPENDENT_AMBULATORY_CARE_PROVIDER_SITE_OTHER): Payer: 59 | Admitting: Psychology

## 2022-03-29 DIAGNOSIS — F411 Generalized anxiety disorder: Secondary | ICD-10-CM | POA: Diagnosis not present
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
## 2022-04-12 ENCOUNTER — Ambulatory Visit: Payer: 59 | Admitting: Psychology

## 2022-04-17 ENCOUNTER — Telehealth: Payer: Self-pay

## 2022-04-17 NOTE — Telephone Encounter (Signed)
PA initiated via Covermymeds; KEY: [id number]. Awaiting determination.

## 2022-04-17 NOTE — Telephone Encounter (Signed)
PA approved.   [id number];Review Type:Prior Auth;Coverage Start Date:03/18/2022;Coverage End Date:04/17/2023;

## 2022-04-26 ENCOUNTER — Ambulatory Visit: Payer: 59 | Admitting: Psychology

## 2022-04-27 IMAGING — DX DG HUMERUS 2V *L*
2 series · 2 of 2 positions shown · non-contrast
Comparison: None.

CLINICAL DATA: Humeral fracture fixation.

EXAM:
LEFT HUMERUS - 2+ VIEW

[humerus lat]
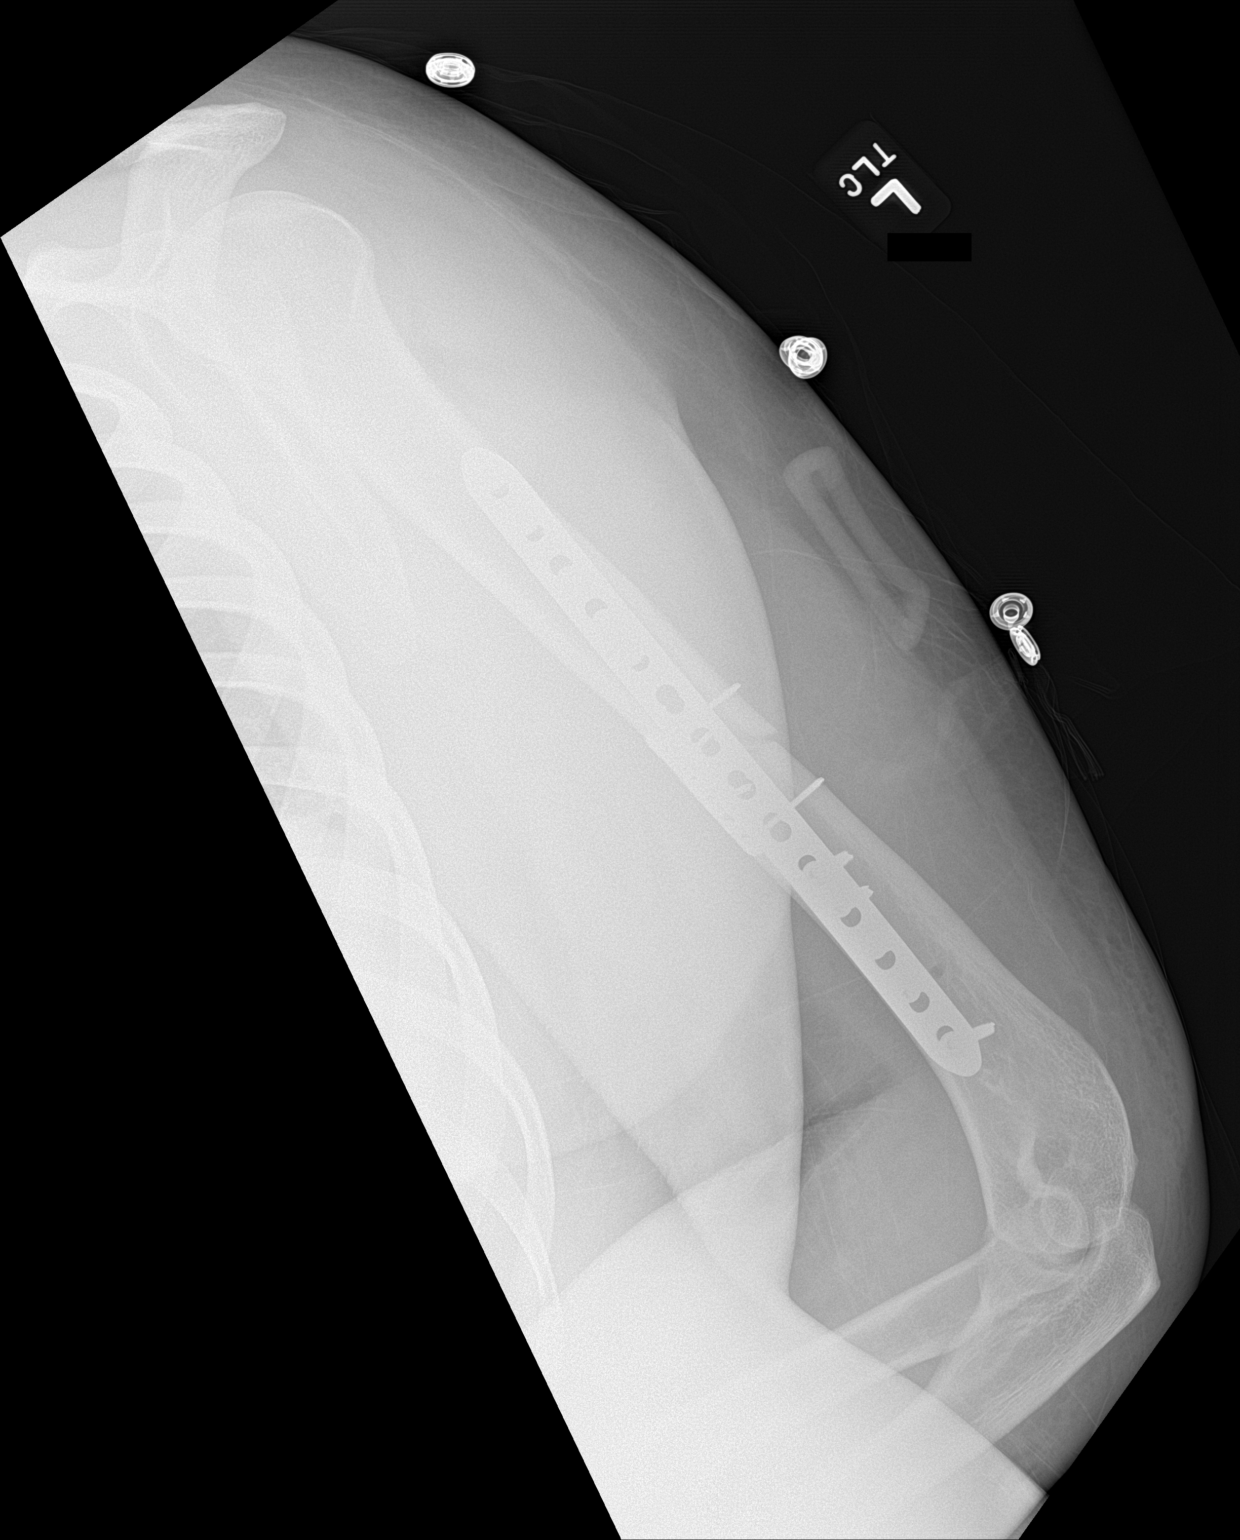

[humerus ap]
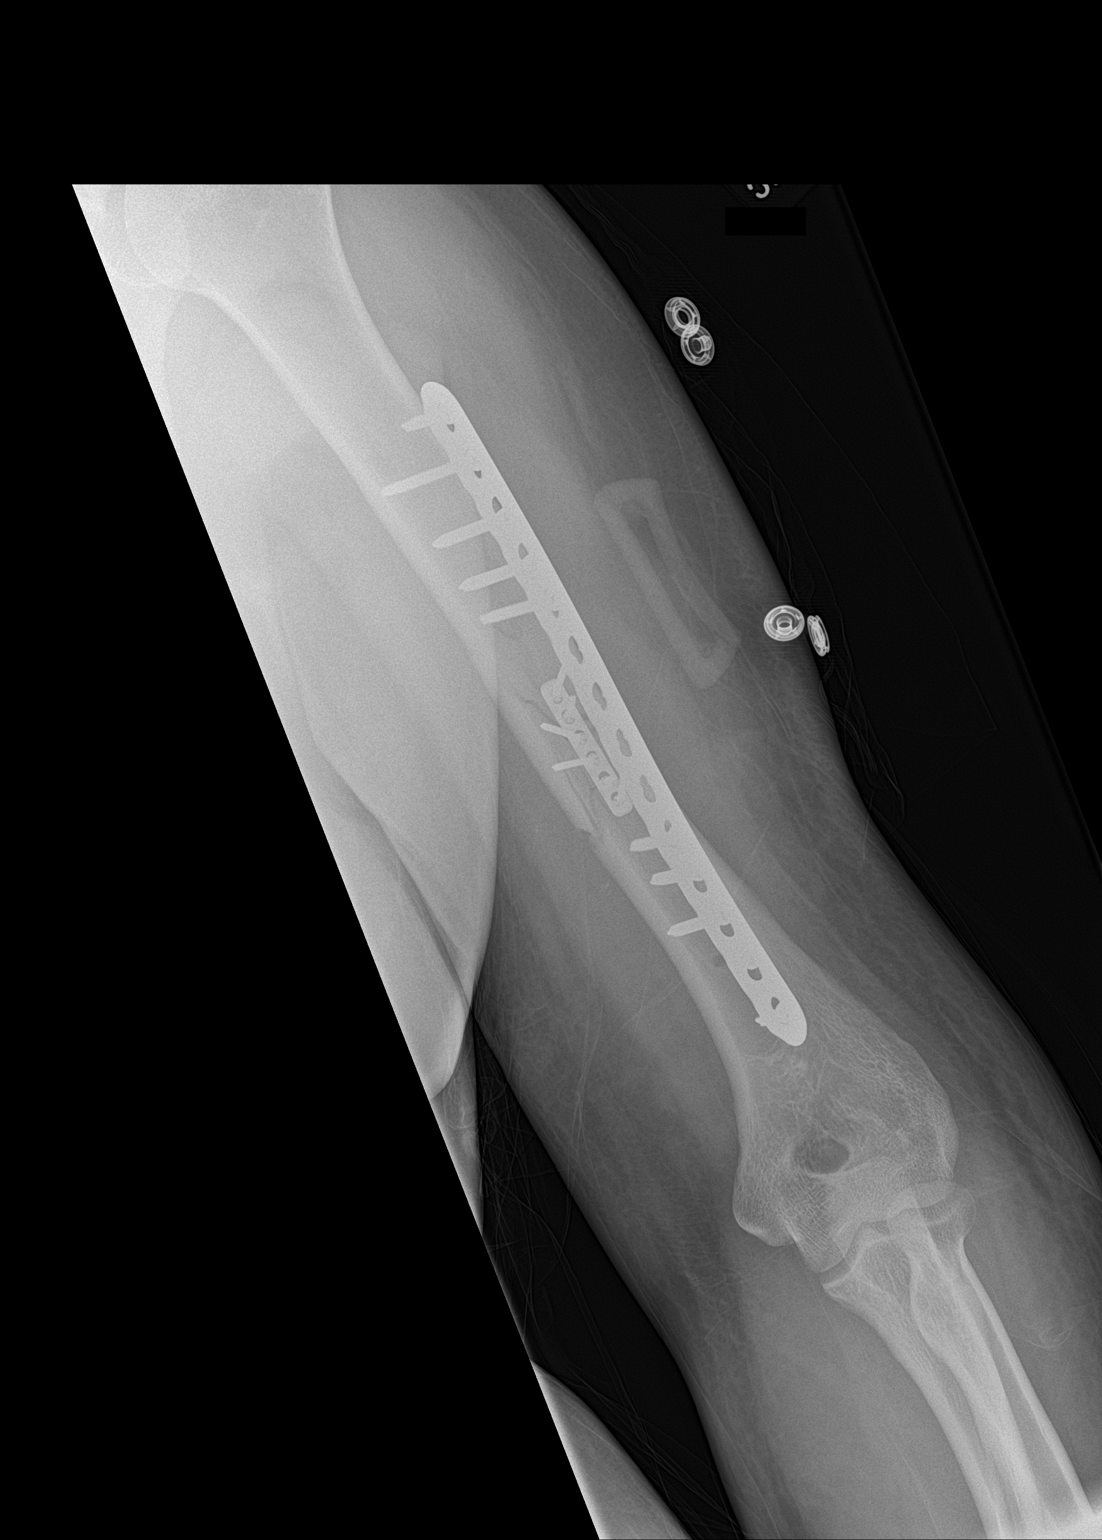

[2 of 2 positions shown; findings below may reference images not displayed]

FINDINGS: There is a long sideplate and numerous compression screws
transfixing the mid humeral fracture. Anatomic alignment. A smaller
plate and screws are also noted. The shoulder and elbow joints are
maintained.
IMPRESSION: Internal fixation of mid humeral fracture with anatomic alignment.

## 2022-05-10 ENCOUNTER — Ambulatory Visit: Payer: 59 | Admitting: Psychology

## 2022-06-28 ENCOUNTER — Ambulatory Visit: Payer: 59 | Admitting: Psychology

## 2022-08-09 ENCOUNTER — Encounter: Payer: Self-pay | Admitting: Family Medicine

## 2022-08-09 ENCOUNTER — Ambulatory Visit (INDEPENDENT_AMBULATORY_CARE_PROVIDER_SITE_OTHER): Payer: Managed Care, Other (non HMO) | Admitting: Family Medicine

## 2022-08-09 ENCOUNTER — Other Ambulatory Visit (HOSPITAL_COMMUNITY)
Admission: RE | Admit: 2022-08-09 | Discharge: 2022-08-09 | Disposition: A | Discharge status: Home / Self Care | Payer: Managed Care, Other (non HMO) | Source: Ambulatory Visit | Attending: Family Medicine | Admitting: Family Medicine

## 2022-08-09 VITALS — BP 111/71 | HR 74 | Ht 68.0 in | Wt 214.0 lb

## 2022-08-09 DIAGNOSIS — Z113 Encounter for screening for infections with a predominantly sexual mode of transmission: Secondary | ICD-10-CM | POA: Diagnosis present

## 2022-08-09 DIAGNOSIS — R5383 Other fatigue: Secondary | ICD-10-CM

## 2022-08-09 DIAGNOSIS — R233 Spontaneous ecchymoses: Secondary | ICD-10-CM | POA: Diagnosis not present

## 2022-08-09 DIAGNOSIS — Z0001 Encounter for general adult medical examination with abnormal findings: Secondary | ICD-10-CM

## 2022-08-09 DIAGNOSIS — Z13220 Encounter for screening for lipoid disorders: Secondary | ICD-10-CM | POA: Diagnosis not present

## 2022-08-09 DIAGNOSIS — Z131 Encounter for screening for diabetes mellitus: Secondary | ICD-10-CM

## 2022-08-09 DIAGNOSIS — Z1329 Encounter for screening for other suspected endocrine disorder: Secondary | ICD-10-CM

## 2022-08-09 DIAGNOSIS — Z3041 Encounter for surveillance of contraceptive pills: Secondary | ICD-10-CM

## 2022-08-09 LAB — CBC WITH DIFFERENTIAL/PLATELET
Basophils Absolute: 0 10*3/uL (ref 0.0–0.1)
Basophils Relative: 0.6 % (ref 0.0–3.0)
Eosinophils Absolute: 0.1 10*3/uL (ref 0.0–0.7)
Eosinophils Relative: 1.3 % (ref 0.0–5.0)
HCT: 42.2 % (ref 36.0–46.0)
Hemoglobin: 13.7 g/dL (ref 12.0–15.0)
Lymphocytes Relative: 33.3 % (ref 12.0–46.0)
Lymphs Abs: 2.3 10*3/uL (ref 0.7–4.0)
MCHC: 32.4 g/dL (ref 30.0–36.0)
MCV: 87.7 fl (ref 78.0–100.0)
Monocytes Absolute: 0.6 10*3/uL (ref 0.1–1.0)
Monocytes Relative: 8.3 % (ref 3.0–12.0)
Neutro Abs: 3.9 10*3/uL (ref 1.4–7.7)
Neutrophils Relative %: 56.5 % (ref 43.0–77.0)
Platelets: 303 10*3/uL (ref 150.0–400.0)
RBC: 4.82 Mil/uL (ref 3.87–5.11)
RDW: 13.2 % (ref 11.5–15.5)
WBC: 6.9 10*3/uL (ref 4.0–10.5)

## 2022-08-09 LAB — IBC + FERRITIN
Ferritin: 50.5 ng/mL (ref 10.0–291.0)
Iron: 102 ug/dL (ref 42–145)
Saturation Ratios: 23.9 % (ref 20.0–50.0)
TIBC: 427 ug/dL (ref 250.0–450.0)
Transferrin: 305 mg/dL (ref 212.0–360.0)

## 2022-08-09 LAB — COMPREHENSIVE METABOLIC PANEL
ALT: 11 U/L (ref 0–35)
AST: 14 U/L (ref 0–37)
Albumin: 4.6 g/dL (ref 3.5–5.2)
Alkaline Phosphatase: 41 U/L (ref 39–117)
BUN: 18 mg/dL (ref 6–23)
CO2: 25 mEq/L (ref 19–32)
Calcium: 9.7 mg/dL (ref 8.4–10.5)
Chloride: 105 mEq/L (ref 96–112)
Creatinine, Ser: 0.79 mg/dL (ref 0.40–1.20)
GFR: 103.63 mL/min (ref >60.00)
Glucose, Bld: 85 mg/dL (ref 70–99)
Potassium: 4.2 mEq/L (ref 3.5–5.1)
Sodium: 139 mEq/L (ref 135–145)
Total Bilirubin: 0.6 mg/dL (ref 0.2–1.2)
Total Protein: 7.5 g/dL (ref 6.0–8.3)

## 2022-08-09 LAB — LIPID PANEL
Cholesterol: 154 mg/dL (ref 0–200)
HDL: 52.7 mg/dL (ref >39.00)
LDL Cholesterol: 90 mg/dL (ref 0–99)
NonHDL: 101.67
Total CHOL/HDL Ratio: 3
Triglycerides: 60 mg/dL (ref 0.0–149.0)
VLDL: 12 mg/dL (ref 0.0–40.0)

## 2022-08-09 LAB — TSH: TSH: 1.48 u[IU]/mL (ref 0.35–5.50)

## 2022-08-09 MED ORDER — LO LOESTRIN FE 1 MG-10 MCG / 10 MCG PO TABS: 1.0000 | ORAL_TABLET | Freq: Every day | ORAL | 3 refills | Status: AC

## 2022-08-09 NOTE — Progress Notes (Signed)
Complete physical exam  Patient: Diana Reed   DOB: 09/11/1995   26 y.o. Female  MRN: [id number]  Subjective:    Chief Complaint  Patient presents with   Annual Exam    Diana Reed is a 27 y.o. female who presents today for a complete physical exam. She reports consuming a general diet. Home exercise routine includes cardio and weight lifting. She generally feels well. She reports sleeping well. She does not have additional problems to discuss today.   Currently lives with: alone Acute concerns or interim problems since last visit: none  Vision concerns: no concerns Dental concerns: no concerns STD concerns: unsure, no symptoms, but routine screening requesting   Patient endorses ETOH use. - once a week at most Patient denies nicotine use. Patient denies illegal substance use.    Females:  She is currently  sexually active with  female  partners. She denies  concerns today about STI - would like routine screening Contraception choices are: oral contraceptives LMP: n/a, COC      Most recent fall risk assessment:    08/09/2022    8:54 AM  Fall Risk   Falls in the past year? 0  Number falls in past yr: 0  Injury with Fall? 0  Risk for fall due to : No Fall Risks  Follow up Falls evaluation completed     Most recent depression screenings:    03/08/2022    9:50 AM 02/01/2022    8:56 AM  PHQ 2/9 Scores  PHQ - 2 Score 2 4  PHQ- 9 Score 8 14            Patient Care Team: Clayborne Dana, NP as PCP - General (Family Medicine)   Outpatient Medications Prior to Visit  Medication Sig   buPROPion (WELLBUTRIN XL) 150 MG 24 hr tablet Take 1 tablet (150 mg total) by mouth daily.   clobetasol (TEMOVATE) 0.05 % external solution APPLY 1 APPLICATION TOPICALLY TWICE A DAY   ondansetron (ZOFRAN-ODT) 4 MG disintegrating tablet Take 1 tablet (4 mg total) by mouth every 8 (eight) hours as needed for nausea or vomiting.   sertraline (ZOLOFT) 50 MG tablet  Take 1 tablet (50 mg total) by mouth daily.   [DISCONTINUED] LO LOESTRIN FE 1 MG-10 MCG / 10 MCG tablet Take 1 tablet by mouth daily. (Patient not taking: Reported on 08/09/2022)   No facility-administered medications prior to visit.    ROS All review of systems negative except what is listed in the HPI        Objective:     BP 111/71   Pulse 74   Ht 5\' 8"  (1.727 m)   Wt 214 lb (97.1 kg)   SpO2 100%   BMI 32.54 kg/m    Physical Exam Vitals reviewed.  Constitutional:      General: She is not in acute distress.    Appearance: Normal appearance. She is not ill-appearing.  HENT:     Head: Normocephalic and atraumatic.     Right Ear: Tympanic membrane normal.     Left Ear: Tympanic membrane normal.     Nose: Nose normal.     Mouth/Throat:     Mouth: Mucous membranes are moist.     Pharynx: Oropharynx is clear.  Eyes:     Extraocular Movements: Extraocular movements intact.     Conjunctiva/sclera: Conjunctivae normal.     Pupils: Pupils are equal, round, and reactive to light.  Neck:     Vascular: No  carotid bruit.  Cardiovascular:     Rate and Rhythm: Normal rate and regular rhythm.     Pulses: Normal pulses.     Heart sounds: Normal heart sounds.  Pulmonary:     Effort: Pulmonary effort is normal.     Breath sounds: Normal breath sounds.  Abdominal:     General: Abdomen is flat. Bowel sounds are normal. There is no distension.     Palpations: Abdomen is soft. There is no mass.     Tenderness: There is no abdominal tenderness. There is no right CVA tenderness, left CVA tenderness, guarding or rebound.  Genitourinary:    Comments: Deferred exam Musculoskeletal:        General: Normal range of motion.     Cervical back: Normal range of motion and neck supple. No tenderness.     Right lower leg: No edema.     Left lower leg: No edema.  Lymphadenopathy:     Cervical: No cervical adenopathy.  Skin:    General: Skin is warm and dry.     Capillary Refill:  Capillary refill takes less than 2 seconds.  Neurological:     General: No focal deficit present.     Mental Status: She is alert and oriented to person, place, and time. Mental status is at baseline.  Psychiatric:        Mood and Affect: Mood normal.        Behavior: Behavior normal.        Thought Content: Thought content normal.        Judgment: Judgment normal.      No results found for any visits on 08/09/22.     Assessment & Plan:    Routine Health Maintenance and Physical Exam Discussed health promotion and safety including diet and exercise recommendations, dental health, and injury prevention. Tobacco cessation if applicable. Seat belts, sunscreen, smoke detectors, etc.    Immunization History  Administered Date(s) Administered   Janssen (J&J) SARS-COV-2 Vaccination 12/24/2019   Tdap 10/05/2017    Health Maintenance  Topic Date Due   COVID-19 Vaccine (2 - 2023-24 season) 08/08/2023 (Originally 10/28/2021)   HPV VACCINES (1 - 2-dose series) 08/09/2023 (Originally 09/21/2007)   INFLUENZA VACCINE  09/28/2022   PAP-Cervical Cytology Screening  08/03/2024   PAP SMEAR-Modifier  08/03/2024   DTaP/Tdap/Td (2 - Td or Tdap) 10/06/2027   Hepatitis C Screening  Completed   HIV Screening  Completed        Problem List Items Addressed This Visit   None Visit Diagnoses     Encounter for routine adult health examination with abnormal findings    -  Primary   Relevant Orders   CBC with Differential/Platelet   Comprehensive metabolic panel   Lipid panel   TSH   Lipid screening       Relevant Orders   Lipid panel   Diabetes mellitus screening       Relevant Orders   Comprehensive metabolic panel   Thyroid disorder screen       Relevant Orders   TSH   Encounter for surveillance of contraceptive pills       Relevant Medications   LO LOESTRIN FE 1 MG-10 MCG / 10 MCG tablet   Fatigue, unspecified type     Easy bruising   Labs today Discussed symptoms to monitor  for Encouraged healthy diet, regular exercise, adequate sleep   Relevant Orders   CBC with Differential/Platelet   TSH   IBC + Ferritin  Relevant Orders   CBC with Differential/Platelet   IBC + Ferritin   Screen for STD (sexually transmitted disease)       Relevant Orders   HIV Antibody (routine testing w rflx)   HSV(herpes simplex vrs) 1+2 ab-IgG   RPR   Cervicovaginal ancillary only      Return in about 1 year (around 08/09/2023) for physical.     Clayborne Dana, NP

## 2022-08-10 LAB — CERVICOVAGINAL ANCILLARY ONLY
Bacterial Vaginitis (gardnerella): NEGATIVE
Candida Glabrata: NEGATIVE
Candida Vaginitis: NEGATIVE
Chlamydia: NEGATIVE
Comment: NEGATIVE
Comment: NEGATIVE
Comment: NEGATIVE
Comment: NEGATIVE
Comment: NEGATIVE
Comment: NORMAL
Neisseria Gonorrhea: NEGATIVE
Trichomonas: NEGATIVE

## 2022-08-10 LAB — HSV(HERPES SIMPLEX VRS) I + II AB-IGG
HAV 1 IGG,TYPE SPECIFIC AB: <0.9 index
HSV 2 IGG,TYPE SPECIFIC AB: <0.9 index

## 2022-08-10 LAB — RPR: RPR Ser Ql: NONREACTIVE

## 2022-08-10 LAB — HIV ANTIBODY (ROUTINE TESTING W REFLEX): HIV 1&2 Ab, 4th Generation: NONREACTIVE

## 2023-01-03 ENCOUNTER — Telehealth: Payer: Self-pay | Admitting: Family Medicine

## 2023-01-03 DIAGNOSIS — Z309 Encounter for contraceptive management, unspecified: Secondary | ICD-10-CM

## 2023-01-03 NOTE — Addendum Note (Signed)
Addended bySilvio Pate on: 01/03/2023 03:11 PM   Modules accepted: Orders

## 2023-01-03 NOTE — Telephone Encounter (Signed)
Pt called and would like a referral sent to GYN to start the process of tubal ligation. Please advise with patient if referral can be sent or if patient needs OV.

## 2023-01-03 NOTE — Addendum Note (Signed)
Addended by: Hyman Hopes B on: 01/03/2023 09:35 PM   Modules accepted: Orders

## 2023-08-08 ENCOUNTER — Encounter: Payer: Self-pay | Admitting: Family Medicine

## 2023-08-08 ENCOUNTER — Ambulatory Visit (HOSPITAL_BASED_OUTPATIENT_CLINIC_OR_DEPARTMENT_OTHER)
Admission: RE | Admit: 2023-08-08 | Discharge: 2023-08-08 | Disposition: A | Discharge status: Home / Self Care | Source: Ambulatory Visit | Attending: Family Medicine | Admitting: Family Medicine

## 2023-08-08 ENCOUNTER — Ambulatory Visit (INDEPENDENT_AMBULATORY_CARE_PROVIDER_SITE_OTHER): Admitting: Family Medicine

## 2023-08-08 VITALS — BP 127/74 | HR 88 | Temp 98.3°F | Resp 16 | Ht 68.0 in | Wt 236.7 lb

## 2023-08-08 DIAGNOSIS — M79604 Pain in right leg: Secondary | ICD-10-CM | POA: Insufficient documentation
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
## 2023-08-09 ENCOUNTER — Ambulatory Visit: Payer: Self-pay | Admitting: Family Medicine

## 2024-03-31 NOTE — Progress Notes (Signed)
 "  Complete physical exam  Patient: Diana Reed   DOB: 1995-11-24   29 y.o. Female  MRN: [id number]  Subjective:    No chief complaint on file.   Diana Reed is a 29 y.o. female who presents today for a complete physical exam. She reports consuming a {diet types:17450} diet. {types:19826} She generally feels {DESC; WELL/FAIRLY WELL/POORLY:18703}. She reports sleeping {DESC; WELL/FAIRLY WELL/POORLY:18703}. She {does/does not:200015} have additional problems to discuss today.   Currently lives with: *** Acute concerns or interim problems since last visit: ***  Chronic Problems  Mood: - Diagnosis: Anxiety/Depression  - Treatment: Bupropion  150 mg daily and Sertraline  50 mg daily. - Medication side effects: *** - SI/HI: {YES/NO AS:20300} - Update: ***   Vision concerns: {Concerns:34670} Dental concerns: {Concerns:34670} STD concerns: {Concerns:34670}  ETOH use:  reports current alcohol use of about 2.0 standard drinks of alcohol per week. Nicotine use:  reports that she has never smoked. She has never used smokeless tobacco. Recreational drugs/illegal substances: {yes/no:20286}  Females:  She is {Blank single:19197::currently,not currently,never} sexually active   Contraception choices are: OCP  LMP: {ELTBLMP:34675}         Most recent fall risk assessment:    08/09/2022    8:54 AM  Fall Risk   Falls in the past year? 0  Number falls in past yr: 0  Injury with Fall? 0   Risk for fall due to : No Fall Risks  Follow up Falls evaluation completed     Data saved with a previous flowsheet row definition     Most recent depression screenings:    08/09/2022    9:16 AM 03/08/2022    9:50 AM  PHQ 2/9 Scores  PHQ - 2 Score 2 2  PHQ- 9 Score 10  8      Data saved with a previous flowsheet row definition       08/09/2022    9:17 AM 03/08/2022    9:21 AM 02/01/2022    8:57 AM 09/14/2021    8:30 AM  GAD 7 : Generalized Anxiety Score  Nervous,  Anxious, on Edge 1  1  2  1    Control/stop worrying 1  1  2  1    Worry too much - different things 1  2  2  1    Trouble relaxing 1  1  2  1    Restless 1  1  1   0   Easily annoyed or irritable 0  1  1  1    Afraid - awful might happen 0   1  0   Total GAD 7 Score 5  11 5   Anxiety Difficulty Not difficult at all Not difficult at all Somewhat difficult Somewhat difficult     Data saved with a previous flowsheet row definition    {History (Optional):23778}  Patient Care Team: Almarie Waddell NOVAK, NP as PCP - General (Family Medicine)   Show/hide medication list[1]  ROS        Objective:     There were no vitals taken for this visit. {Vitals History (Optional):23777}  Physical Exam   No results found for any visits on 04/02/24. {Show previous labs (optional):23779}    Assessment & Plan:    Routine Health Maintenance and Physical Exam Discussed health promotion and safety including diet and exercise recommendations, dental health, and injury prevention. Tobacco cessation if applicable. Seat belts, sunscreen, smoke detectors, etc.    Immunization History  Administered Date(s) Administered   Janssen (J&J) SARS-COV-2 Vaccination 12/24/2019  Tdap 10/05/2017    Health Maintenance  Topic Date Due   Hepatitis B Vaccines 19-59 Average Risk (1 of 3 - 19+ 3-dose series) Never done   Influenza Vaccine  Never done   COVID-19 Vaccine (2 - 2025-26 season) 10/29/2023   Cervical Cancer Screening (Pap smear)  08/03/2024   DTaP/Tdap/Td (2 - Td or Tdap) 10/06/2027   HPV VACCINES (No Doses Required) Completed   Hepatitis C Screening  Completed   HIV Screening  Completed   Pneumococcal Vaccine  Aged Out   Meningococcal B Vaccine  Aged Out        Problem List Items Addressed This Visit   None      PATIENT COUNSELING:   Advised to take 1 mg of folate supplement per day if capable of pregnancy.   Recommend that most people either abstain from alcohol or drink within safe  limits (<=14/week and <=4 drinks/occasion for males, <=7/weeks and <= 3 drinks/occasion for females) and that the risk for alcohol disorders and other health effects rises proportionally with the number of drinks per week and how often a drinker exceeds daily limits.   Diet: Recommend to adjust caloric intake to maintain or achieve ideal body weight, to reduce intake of dietary saturated fat and total fat, to limit sodium intake by avoiding high sodium foods and not adding table salt, and to maintain adequate dietary potassium and calcium  preferably from fresh fruits, vegetables, and low-fat dairy products.   Emphasized the importance of regular exercise.  Injury prevention: Recommend seatbelts, safety helmets, smoke detector, etc..   Dental health: Recommend regular tooth brushing, flossing, and dental visits.       No follow-ups on file.     Waddell KATHEE Mon, NP  I,Emily Lagle,acting as a scribe for Waddell KATHEE Mon, NP.,have documented all relevant documentation on the behalf of Waddell KATHEE Mon, NP.  I, Waddell KATHEE Mon, NP, have reviewed all documentation for this visit. The documentation on 04/02/2024 for the exam, diagnosis, procedures, and orders are all accurate and complete.     [1]  Outpatient Medications Prior to Visit  Medication Sig   buPROPion  (WELLBUTRIN  XL) 150 MG 24 hr tablet Take 1 tablet (150 mg total) by mouth daily.   clobetasol  (TEMOVATE ) 0.05 % external solution APPLY 1 APPLICATION TOPICALLY TWICE A DAY   LO LOESTRIN FE  1 MG-10 MCG / 10 MCG tablet Take 1 tablet by mouth daily.   sertraline  (ZOLOFT ) 50 MG tablet Take 1 tablet (50 mg total) by mouth daily.   No facility-administered medications prior to visit.   "

## 2024-04-02 ENCOUNTER — Ambulatory Visit: Admitting: Family Medicine

## 2024-04-02 ENCOUNTER — Encounter: Payer: Self-pay | Admitting: Family Medicine

## 2024-04-02 VITALS — BP 111/82 | HR 89 | Ht 68.0 in | Wt 256.0 lb

## 2024-04-02 DIAGNOSIS — Z0000 Encounter for general adult medical examination without abnormal findings: Secondary | ICD-10-CM

## 2024-04-02 DIAGNOSIS — F32A Depression, unspecified: Secondary | ICD-10-CM

## 2024-04-02 LAB — CBC WITH DIFFERENTIAL/PLATELET
Basophils Absolute: 0 10*3/uL (ref 0.0–0.1)
Basophils Relative: 0.5 % (ref 0.0–3.0)
Eosinophils Absolute: 0.1 10*3/uL (ref 0.0–0.7)
Eosinophils Relative: 1.2 % (ref 0.0–5.0)
HCT: 41.1 % (ref 36.0–46.0)
Hemoglobin: 13.6 g/dL (ref 12.0–15.0)
Lymphocytes Relative: 24.3 % (ref 12.0–46.0)
Lymphs Abs: 1.9 10*3/uL (ref 0.7–4.0)
MCHC: 33 g/dL (ref 30.0–36.0)
MCV: 85.9 fl (ref 78.0–100.0)
Monocytes Absolute: 0.6 10*3/uL (ref 0.1–1.0)
Monocytes Relative: 7.3 % (ref 3.0–12.0)
Neutro Abs: 5.2 10*3/uL (ref 1.4–7.7)
Neutrophils Relative %: 66.7 % (ref 43.0–77.0)
Platelets: 315 10*3/uL (ref 150.0–400.0)
RBC: 4.78 Mil/uL (ref 3.87–5.11)
RDW: 13.2 % (ref 11.5–15.5)
WBC: 7.7 10*3/uL (ref 4.0–10.5)

## 2024-04-02 LAB — COMPREHENSIVE METABOLIC PANEL WITH GFR
ALT: 19 U/L (ref 3–35)
AST: 18 U/L (ref 5–37)
Albumin: 4.4 g/dL (ref 3.5–5.2)
Alkaline Phosphatase: 46 U/L (ref 39–117)
BUN: 14 mg/dL (ref 6–23)
CO2: 24 meq/L (ref 19–32)
Calcium: 9 mg/dL (ref 8.4–10.5)
Chloride: 104 meq/L (ref 96–112)
Creatinine, Ser: 0.64 mg/dL (ref 0.40–1.20)
GFR: 121.02 mL/min
Glucose, Bld: 88 mg/dL (ref 70–99)
Potassium: 3.8 meq/L (ref 3.5–5.1)
Sodium: 140 meq/L (ref 135–145)
Total Bilirubin: 0.5 mg/dL (ref 0.2–1.2)
Total Protein: 7.2 g/dL (ref 6.0–8.3)

## 2024-04-02 LAB — LIPID PANEL
Cholesterol: 181 mg/dL (ref 28–200)
HDL: 55 mg/dL
LDL Cholesterol: 112 mg/dL — ABNORMAL HIGH (ref 10–99)
NonHDL: 126.29
Total CHOL/HDL Ratio: 3
Triglycerides: 69 mg/dL (ref 10.0–149.0)
VLDL: 13.8 mg/dL (ref 0.0–40.0)

## 2024-04-02 LAB — TSH: TSH: 1.11 u[IU]/mL (ref 0.35–5.50)

## 2024-04-02 MED ORDER — SERTRALINE HCL 50 MG PO TABS: 50.0000 mg | ORAL_TABLET | Freq: Every day | ORAL | 1 refills | Status: AC

## 2024-04-02 MED ORDER — BUPROPION HCL ER (XL) 150 MG PO TB24: 150.0000 mg | ORAL_TABLET | Freq: Every day | ORAL | 1 refills | Status: AC

## 2024-04-02 NOTE — Assessment & Plan Note (Signed)
 No SI/HI. PHQ9/GAD7 reviewed.  Restarting meds - refills provided.  Handout with local counseling resources provided.  Follow-up in 6 weeks.

## 2024-04-03 ENCOUNTER — Ambulatory Visit: Payer: Self-pay | Admitting: Family Medicine

## 2024-04-03 ENCOUNTER — Other Ambulatory Visit: Payer: Self-pay | Admitting: Medical Genetics

## 2024-05-21 ENCOUNTER — Ambulatory Visit: Admitting: Family Medicine
